# Patient Record
Sex: Male | Born: 1990
Health system: Southern US, Community
[De-identification: ages and names within clinical notes are randomized; demographics above are authoritative.]

## PROBLEM LIST (undated history)

## (undated) DIAGNOSIS — K9041 Non-celiac gluten sensitivity: Secondary | ICD-10-CM

## (undated) DIAGNOSIS — H18603 Keratoconus, unspecified, bilateral: Secondary | ICD-10-CM

## (undated) HISTORY — DX: Non-celiac gluten sensitivity: K90.41

## (undated) HISTORY — DX: Keratoconus, unspecified, bilateral: H18.603

## (undated) HISTORY — PX: SURGERY SCROTAL / TESTICULAR: SUR1316

## (undated) HISTORY — PX: KNEE SURGERY: SHX244

---

## 2013-03-29 ENCOUNTER — Ambulatory Visit: Payer: Self-pay | Admitting: General Practice

## 2014-05-25 ENCOUNTER — Emergency Department: Payer: Self-pay | Admitting: Emergency Medicine

## 2015-08-12 ENCOUNTER — Ambulatory Visit
Admission: RE | Admit: 2015-08-12 | Discharge: 2015-08-12 | Disposition: A | Discharge status: Home / Self Care | Payer: Worker's Compensation | Source: Ambulatory Visit | Attending: Family Medicine | Admitting: Family Medicine

## 2015-08-12 ENCOUNTER — Other Ambulatory Visit: Payer: Self-pay | Admitting: Family Medicine

## 2015-08-12 DIAGNOSIS — G629 Polyneuropathy, unspecified: Secondary | ICD-10-CM

## 2015-08-12 DIAGNOSIS — M5416 Radiculopathy, lumbar region: Secondary | ICD-10-CM | POA: Insufficient documentation

## 2017-01-18 ENCOUNTER — Encounter: Payer: Self-pay | Admitting: *Deleted

## 2017-01-18 ENCOUNTER — Emergency Department
Admission: EM | Admit: 2017-01-18 | Discharge: 2017-01-18 | Disposition: A | Discharge status: Home / Self Care | Payer: BLUE CROSS/BLUE SHIELD | Source: Home / Self Care | Attending: Emergency Medicine | Admitting: Emergency Medicine

## 2017-01-18 ENCOUNTER — Emergency Department: Payer: BLUE CROSS/BLUE SHIELD

## 2017-01-18 DIAGNOSIS — Y939 Activity, unspecified: Secondary | ICD-10-CM | POA: Insufficient documentation

## 2017-01-18 DIAGNOSIS — Z79899 Other long term (current) drug therapy: Secondary | ICD-10-CM

## 2017-01-18 DIAGNOSIS — M7042 Prepatellar bursitis, left knee: Secondary | ICD-10-CM | POA: Diagnosis present

## 2017-01-18 DIAGNOSIS — L03116 Cellulitis of left lower limb: Secondary | ICD-10-CM | POA: Diagnosis present

## 2017-01-18 DIAGNOSIS — A419 Sepsis, unspecified organism: Principal | ICD-10-CM | POA: Diagnosis present

## 2017-01-18 DIAGNOSIS — Z791 Long term (current) use of non-steroidal anti-inflammatories (NSAID): Secondary | ICD-10-CM

## 2017-01-18 DIAGNOSIS — Z792 Long term (current) use of antibiotics: Secondary | ICD-10-CM

## 2017-01-18 DIAGNOSIS — N179 Acute kidney failure, unspecified: Secondary | ICD-10-CM | POA: Diagnosis present

## 2017-01-18 DIAGNOSIS — E876 Hypokalemia: Secondary | ICD-10-CM | POA: Diagnosis present

## 2017-01-18 DIAGNOSIS — Z79891 Long term (current) use of opiate analgesic: Secondary | ICD-10-CM

## 2017-01-18 DIAGNOSIS — B9561 Methicillin susceptible Staphylococcus aureus infection as the cause of diseases classified elsewhere: Secondary | ICD-10-CM | POA: Diagnosis present

## 2017-01-18 LAB — COMPREHENSIVE METABOLIC PANEL
ALBUMIN: 4.9 g/dL (ref 3.5–5.0)
ALT: 19 U/L (ref 17–63)
AST: 21 U/L (ref 15–41)
Alkaline Phosphatase: 55 U/L (ref 38–126)
Anion gap: 7 (ref 5–15)
BUN: 13 mg/dL (ref 6–20)
CALCIUM: 10 mg/dL (ref 8.9–10.3)
CO2: 25 mmol/L (ref 22–32)
CREATININE: 1.28 mg/dL — AB (ref 0.61–1.24)
Chloride: 107 mmol/L (ref 101–111)
GFR calc Af Amer: >60 mL/min (ref >60)
Glucose, Bld: 134 mg/dL — ABNORMAL HIGH (ref 65–99)
POTASSIUM: 3.9 mmol/L (ref 3.5–5.1)
SODIUM: 139 mmol/L (ref 135–145)
TOTAL PROTEIN: 7.5 g/dL (ref 6.5–8.1)
Total Bilirubin: 1.5 mg/dL — ABNORMAL HIGH (ref 0.3–1.2)

## 2017-01-18 LAB — CBC WITH DIFFERENTIAL/PLATELET
BASOS ABS: 0 10*3/uL (ref 0–0.1)
BASOS PCT: 0 %
EOS ABS: 0.1 10*3/uL (ref 0–0.7)
EOS PCT: 1 %
HCT: 44.8 % (ref 40.0–52.0)
Hemoglobin: 15.8 g/dL (ref 13.0–18.0)
Lymphocytes Relative: 9 %
Lymphs Abs: 1.5 10*3/uL (ref 1.0–3.6)
MCH: 29.7 pg (ref 26.0–34.0)
MCHC: 35.2 g/dL (ref 32.0–36.0)
MCV: 84.3 fL (ref 80.0–100.0)
MONO ABS: 1.2 10*3/uL — AB (ref 0.2–1.0)
MONOS PCT: 7 %
Neutro Abs: 13.9 10*3/uL — ABNORMAL HIGH (ref 1.4–6.5)
Neutrophils Relative %: 83 %
PLATELETS: 200 10*3/uL (ref 150–440)
RBC: 5.31 MIL/uL (ref 4.40–5.90)
RDW: 13.1 % (ref 11.5–14.5)
WBC: 16.8 10*3/uL — ABNORMAL HIGH (ref 3.8–10.6)

## 2017-01-18 LAB — CHLAMYDIA/NGC RT PCR (ARMC ONLY)
CHLAMYDIA TR: NOT DETECTED
N gonorrhoeae: NOT DETECTED

## 2017-01-18 MED ORDER — CLINDAMYCIN PHOSPHATE 600 MG/50ML IV SOLN
600.0000 mg | Freq: Once | INTRAVENOUS | Status: AC
  Administered 2017-01-18: 600 mg via INTRAVENOUS
  Filled 2017-01-18: qty 50

## 2017-01-18 MED ORDER — FENTANYL CITRATE (PF) 100 MCG/2ML IJ SOLN
50.0000 ug | Freq: Once | INTRAMUSCULAR | Status: AC
  Administered 2017-01-18: 50 ug via INTRAMUSCULAR
  Filled 2017-01-18: qty 2

## 2017-01-18 MED ORDER — IBUPROFEN 600 MG PO TABS
600.0000 mg | ORAL_TABLET | Freq: Once | ORAL | Status: AC
  Administered 2017-01-18: 600 mg via ORAL
  Filled 2017-01-18: qty 1

## 2017-01-18 MED ORDER — CEPHALEXIN 500 MG PO CAPS: 500.0000 mg | ORAL_CAPSULE | Freq: Four times a day (QID) | ORAL | 0 refills | Status: DC

## 2017-01-18 MED ORDER — OXYCODONE-ACETAMINOPHEN 5-325 MG PO TABS: 1.0000 | ORAL_TABLET | Freq: Four times a day (QID) | ORAL | 0 refills | Status: DC | PRN

## 2017-01-18 MED ORDER — SULFAMETHOXAZOLE-TRIMETHOPRIM 800-160 MG PO TABS: 1.0000 | ORAL_TABLET | Freq: Two times a day (BID) | ORAL | 0 refills | Status: DC

## 2017-01-18 MED ORDER — CLINDAMYCIN HCL 150 MG PO CAPS: 300.0000 mg | ORAL_CAPSULE | Freq: Once | ORAL | Status: DC

## 2017-01-18 MED ORDER — IBUPROFEN 600 MG PO TABS: 600.0000 mg | ORAL_TABLET | Freq: Four times a day (QID) | ORAL | 0 refills | Status: DC | PRN

## 2017-01-18 MED ORDER — ACETAMINOPHEN 500 MG PO TABS
1000.0000 mg | ORAL_TABLET | Freq: Once | ORAL | Status: AC
Start: 2017-01-18 — End: 2017-01-18
  Administered 2017-01-18: 1000 mg via ORAL
  Filled 2017-01-18: qty 2

## 2017-01-18 MED ORDER — OXYCODONE HCL 5 MG PO TABS
5.0000 mg | ORAL_TABLET | Freq: Once | ORAL | Status: AC
  Administered 2017-01-18: 5 mg via ORAL
  Filled 2017-01-18: qty 1

## 2017-01-18 NOTE — Discharge Instructions (Signed)
follow-up with orthopedics tomorrow for reevaluation. Return to the emergency room for worsening pain, inability to move your knee, fever or chills, nausea, or vomiting. Take antibiotics as prescribed. Take 600 mg of ibuprofen every 6 hours and 1 Percocet as needed every 4-6 hours. Elevate the leg and apply ice.

## 2017-01-18 NOTE — ED Notes (Signed)
Pt resting in bed with SO at bedside. Will continue to monitor for further patient needs. Medication administered per MD order.

## 2017-01-18 NOTE — ED Notes (Signed)
NAD noted at time of D/C. Pt taken to lobby via wheelchair by his SO. Denies any comments/concerns at this time.

## 2017-01-18 NOTE — ED Notes (Signed)
MD at bedside to drain L knee and collect specimens.

## 2017-01-18 NOTE — ED Triage Notes (Signed)
Pt c/o swollen, stabbing pain to left knee. Noticed some discomfort last night and woke this am to 8/10 pain. Non weigh bearing without extreme pain. No history of injury to this knee

## 2017-01-18 NOTE — ED Provider Notes (Signed)
[facility] Emergency Department Provider Note  ____________________________________________  Time seen: Approximately 2:04 PM  I have reviewed the triage vital signs and the nursing notes.   HISTORY  Chief Complaint Knee Pain   HPI Adrian Hughes is a 26 y.o. male no significant past medical history who presents for evaluation of left knee pain and swelling. Patient reports that he kneed on the ground yesterday and he started having pain in his left knee. This morning when he woke up his knee was red and swollen. He is complaining of severe throbbing/sharp constant pain that is worse with ambulation. No fever or chills. No trauma to the knee. Patient is not immunosuppressed. No bleeding disorder.  History reviewed. No pertinent past medical history.  There are no active problems to display for this patient.   History reviewed. No pertinent surgical history.  Prior to Admission medications   Medication Sig Start Date End Date Taking? Authorizing Provider  cephALEXin (KEFLEX) 500 MG capsule Take 1 capsule (500 mg total) by mouth 4 (four) times daily. 01/18/17 01/28/17  Nita Sickle, MD  ibuprofen (ADVIL,MOTRIN) 600 MG tablet Take 1 tablet (600 mg total) by mouth every 6 (six) hours as needed. 01/18/17   Nita Sickle, MD  oxyCODONE-acetaminophen (ROXICET) 5-325 MG tablet Take 1 tablet by mouth every 6 (six) hours as needed. 01/18/17 01/18/18  Nita Sickle, MD  sulfamethoxazole-trimethoprim (BACTRIM DS,SEPTRA DS) 800-160 MG tablet Take 1 tablet by mouth 2 (two) times daily. 01/18/17 01/28/17  Nita Sickle, MD    Allergies Patient has no known allergies.  No family history on file.  Social History Social History  Substance Use Topics  . Smoking status: Never Smoker  . Smokeless tobacco: Never Used  . Alcohol use Yes     Comment: occ    Review of Systems  Constitutional: Negative for fever. Eyes: Negative for visual  changes. ENT: Negative for sore throat. Neck: No neck pain  Cardiovascular: Negative for chest pain. Respiratory: Negative for shortness of breath. Gastrointestinal: Negative for abdominal pain, vomiting or diarrhea. Genitourinary: Negative for dysuria. Musculoskeletal: Negative for back pain. + left knee pain and swelling Skin: Negative for rash. Neurological: Negative for headaches, weakness or numbness. Psych: No SI or HI  ____________________________________________   PHYSICAL EXAM:  VITAL SIGNS: ED Triage Vitals  Enc Vitals Group     BP 01/18/17 1133 (!) 143/92     Pulse Rate 01/18/17 1133 93     Resp 01/18/17 1133 18     Temp 01/18/17 1133 98.1 F (36.7 C)     Temp Source 01/18/17 1133 Oral     SpO2 01/18/17 1133 100 %     Weight 01/18/17 1134 200 lb (90.7 kg)     Height 01/18/17 1134  (1.854 m)     Head Circumference --      Peak Flow --      Pain Score 01/18/17 1133 8     Pain Loc --      Pain Edu? --      Excl. in GC? --     Constitutional: Alert and oriented. Well appearing and in no apparent distress. HEENT:      Head: Normocephalic and atraumatic.         Eyes: Conjunctivae are normal. Sclera is non-icteric.       Mouth/Throat: Mucous membranes are moist.       Neck: Supple with no signs of meningismus. Cardiovascular: Regular rate and rhythm. No murmurs, gallops, or  rubs. 2+ symmetrical distal pulses are present in all extremities. No JVD. Respiratory: Normal respiratory effort. Lungs are clear to auscultation bilaterally. No wheezes, crackles, or rhonchi.  Gastrointestinal: Soft, non tender, and non distended with positive bowel sounds. No rebound or guarding. Musculoskeletal: significant swelling, warmth, and erythema of the left knee at the prepatellar region. Patient has full passive range of motion of the knee Neurologic: Normal speech and language. Face is symmetric. Moving all extremities. No gross focal neurologic deficits are  appreciated. Skin: Skin is warm, dry and intact. No rash noted. Psychiatric: Mood and affect are normal. Speech and behavior are normal.  ____________________________________________   LABS (all labs ordered are listed, but only abnormal results are displayed)  Labs Reviewed  CBC WITH DIFFERENTIAL/PLATELET - Abnormal; Notable for the following:       Result Value   WBC 16.8 (*)    Neutro Abs 13.9 (*)    Monocytes Absolute 1.2 (*)    All other components within normal limits  COMPREHENSIVE METABOLIC PANEL - Abnormal; Notable for the following:    Glucose, Bld 134 (*)    Creatinine, Ser 1.28 (*)    Total Bilirubin 1.5 (*)    All other components within normal limits  CHLAMYDIA/NGC RT PCR ([facility] ONLY)  BODY FLUID CULTURE   ____________________________________________  EKG  none  ____________________________________________  RADIOLOGY  XR knee:  Prepatellar soft tissue swelling suggesting prepatellar bursitis. No acute osseous findings. ____________________________________________   PROCEDURES  Procedure(s) performed: yes .Joint Aspiration/Arthrocentesis Date/Time: 01/18/2017 2:08 PM Performed by: Nita Sickle Authorized by: Nita Sickle   Consent:    Consent obtained:  Verbal   Consent given by:  Patient   Risks discussed:  Bleeding, infection, pain and incomplete drainage Location:    Location:  Knee (patela)   Knee:  L knee Procedure details:    Preparation: Patient was prepped and draped in usual sterile fashion     Needle gauge:  22 G   Ultrasound guidance: yes     Aspirate characteristics:  Bloody Post-procedure details:    Dressing:  Adhesive bandage   Patient tolerance of procedure:  Tolerated well, no immediate complications   Critical Care performed:  None ____________________________________________   INITIAL IMPRESSION / ASSESSMENT AND PLAN / ED COURSE  26 y.o. male no significant past medical history who presents for  evaluation of left knee pain and swelling that Started yesterday after patient was on his knee. No trauma. Patient has no fever and full passive range of motion of the knee and therefore at this time I don't believe the infection is in the patient's joint. X-ray concerning for prepatellar bursitis. Small amount of fluid was collected and sent to the lab for culture. Discussed with Dr. Hyacinth Meeker, orthopedics on call who recommended dose of IV antibiotics and discharged patient home on Keflex and Septra, Ace wrap, ice, and close follow-up in the office in 2 days.     Pertinent labs & imaging results that were available during my care of the patient were reviewed by me and considered in my medical decision making (see chart for details).    ____________________________________________   FINAL CLINICAL IMPRESSION(S) / ED DIAGNOSES  Final diagnoses:  Prepatellar bursitis of left knee      NEW MEDICATIONS STARTED DURING THIS VISIT:  New Prescriptions   CEPHALEXIN (KEFLEX) 500 MG CAPSULE    Take 1 capsule (500 mg total) by mouth 4 (four) times daily.   IBUPROFEN (ADVIL,MOTRIN) 600 MG TABLET    Take  1 tablet (600 mg total) by mouth every 6 (six) hours as needed.   OXYCODONE-ACETAMINOPHEN (ROXICET) 5-325 MG TABLET    Take 1 tablet by mouth every 6 (six) hours as needed.   SULFAMETHOXAZOLE-TRIMETHOPRIM (BACTRIM DS,SEPTRA DS) 800-160 MG TABLET    Take 1 tablet by mouth 2 (two) times daily.     Note:  This document was prepared using Dragon voice recognition software and may include unintentional dictation errors.    Don Perking, [place], MD 01/18/17 [phone]

## 2017-01-19 ENCOUNTER — Encounter: Payer: Self-pay | Admitting: Emergency Medicine

## 2017-01-19 ENCOUNTER — Inpatient Hospital Stay
Admission: EM | Admit: 2017-01-19 | Discharge: 2017-01-25 | DRG: 872 | Disposition: A | Discharge status: Home / Self Care | Payer: BLUE CROSS/BLUE SHIELD | Attending: Internal Medicine | Admitting: Internal Medicine

## 2017-01-19 DIAGNOSIS — A419 Sepsis, unspecified organism: Secondary | ICD-10-CM | POA: Diagnosis present

## 2017-01-19 DIAGNOSIS — M7042 Prepatellar bursitis, left knee: Secondary | ICD-10-CM

## 2017-01-19 LAB — COMPREHENSIVE METABOLIC PANEL
ALT: 15 U/L — ABNORMAL LOW (ref 17–63)
ANION GAP: 10 (ref 5–15)
AST: 17 U/L (ref 15–41)
Albumin: 4.3 g/dL (ref 3.5–5.0)
Alkaline Phosphatase: 55 U/L (ref 38–126)
BILIRUBIN TOTAL: 0.8 mg/dL (ref 0.3–1.2)
BUN: 12 mg/dL (ref 6–20)
CO2: 24 mmol/L (ref 22–32)
Calcium: 9 mg/dL (ref 8.9–10.3)
Chloride: 105 mmol/L (ref 101–111)
Creatinine, Ser: 1.44 mg/dL — ABNORMAL HIGH (ref 0.61–1.24)
Glucose, Bld: 118 mg/dL — ABNORMAL HIGH (ref 65–99)
POTASSIUM: 3.4 mmol/L — AB (ref 3.5–5.1)
Sodium: 139 mmol/L (ref 135–145)
TOTAL PROTEIN: 7 g/dL (ref 6.5–8.1)

## 2017-01-19 LAB — CBC WITH DIFFERENTIAL/PLATELET
Basophils Absolute: 0 10*3/uL (ref 0–0.1)
Basophils Relative: 0 %
Eosinophils Absolute: 0.2 10*3/uL (ref 0–0.7)
Eosinophils Relative: 1 %
HEMATOCRIT: 41.6 % (ref 40.0–52.0)
Hemoglobin: 14.5 g/dL (ref 13.0–18.0)
Lymphocytes Relative: 7 %
Lymphs Abs: 1.1 10*3/uL (ref 1.0–3.6)
MCH: 29.3 pg (ref 26.0–34.0)
MCHC: 34.9 g/dL (ref 32.0–36.0)
MCV: 84 fL (ref 80.0–100.0)
MONO ABS: 1.5 10*3/uL — AB (ref 0.2–1.0)
MONOS PCT: 10 %
NEUTROS ABS: 12.8 10*3/uL — AB (ref 1.4–6.5)
Neutrophils Relative %: 82 %
Platelets: 169 10*3/uL (ref 150–440)
RBC: 4.96 MIL/uL (ref 4.40–5.90)
RDW: 13.1 % (ref 11.5–14.5)
WBC: 15.6 10*3/uL — ABNORMAL HIGH (ref 3.8–10.6)

## 2017-01-19 MED ORDER — KETOROLAC TROMETHAMINE 30 MG/ML IJ SOLN
15.0000 mg | Freq: Once | INTRAMUSCULAR | Status: AC
  Administered 2017-01-19: 15 mg via INTRAVENOUS
  Filled 2017-01-19: qty 1

## 2017-01-19 MED ORDER — SODIUM CHLORIDE 0.9 % IV BOLUS (SEPSIS)
1000.0000 mL | Freq: Once | INTRAVENOUS | Status: AC
  Administered 2017-01-19: 1000 mL via INTRAVENOUS

## 2017-01-19 MED ORDER — CLINDAMYCIN PHOSPHATE 900 MG/50ML IV SOLN
900.0000 mg | Freq: Once | INTRAVENOUS | Status: AC
  Administered 2017-01-19: 900 mg via INTRAVENOUS
  Filled 2017-01-19: qty 50

## 2017-01-19 NOTE — ED Triage Notes (Addendum)
Pt presents to ED with continuing left knee pain and worsening redness. Initial onset of left knee pain and swelling noted on Monday night. Pt seen in this ED last night for the same and had "fluid drawn off" by his MD. Pt was prescribed antibiotics and pain medication and told to return with worsening symptoms. Pt states the redness has more than doubled in size since last night. Pt has been taking all medications as prescribed. Xray performed last night and pt was told no abnormality was noted. Fever earlier today <100. Pt states although probably unrelated he did get a tattoo on his back Sunday night.

## 2017-01-20 DIAGNOSIS — M7042 Prepatellar bursitis, left knee: Secondary | ICD-10-CM | POA: Diagnosis present

## 2017-01-20 DIAGNOSIS — A419 Sepsis, unspecified organism: Secondary | ICD-10-CM | POA: Diagnosis present

## 2017-01-20 DIAGNOSIS — L03116 Cellulitis of left lower limb: Secondary | ICD-10-CM | POA: Diagnosis present

## 2017-01-20 DIAGNOSIS — N179 Acute kidney failure, unspecified: Secondary | ICD-10-CM | POA: Diagnosis present

## 2017-01-20 DIAGNOSIS — E876 Hypokalemia: Secondary | ICD-10-CM | POA: Diagnosis present

## 2017-01-20 DIAGNOSIS — B9561 Methicillin susceptible Staphylococcus aureus infection as the cause of diseases classified elsewhere: Secondary | ICD-10-CM | POA: Diagnosis present

## 2017-01-20 DIAGNOSIS — Z791 Long term (current) use of non-steroidal anti-inflammatories (NSAID): Secondary | ICD-10-CM | POA: Diagnosis not present

## 2017-01-20 DIAGNOSIS — Z79891 Long term (current) use of opiate analgesic: Secondary | ICD-10-CM | POA: Diagnosis not present

## 2017-01-20 DIAGNOSIS — Z79899 Other long term (current) drug therapy: Secondary | ICD-10-CM | POA: Diagnosis not present

## 2017-01-20 DIAGNOSIS — Z792 Long term (current) use of antibiotics: Secondary | ICD-10-CM | POA: Diagnosis not present

## 2017-01-20 LAB — TSH: TSH: 2.098 u[IU]/mL (ref 0.350–4.500)

## 2017-01-20 LAB — HEMOGLOBIN A1C
Hgb A1c MFr Bld: 4.8 % (ref 4.8–5.6)
Mean Plasma Glucose: 91.06 mg/dL

## 2017-01-20 MED ORDER — IBUPROFEN 400 MG PO TABS
600.0000 mg | ORAL_TABLET | Freq: Four times a day (QID) | ORAL | Status: DC
  Administered 2017-01-20 – 2017-01-22 (×8): 600 mg via ORAL
  Filled 2017-01-20 (×8): qty 2

## 2017-01-20 MED ORDER — CEFTRIAXONE SODIUM IN DEXTROSE 20 MG/ML IV SOLN
1.0000 g | INTRAVENOUS | Status: DC
  Filled 2017-01-20: qty 50

## 2017-01-20 MED ORDER — ONDANSETRON HCL 4 MG/2ML IJ SOLN
4.0000 mg | Freq: Four times a day (QID) | INTRAMUSCULAR | Status: DC | PRN
  Administered 2017-01-20: 4 mg via INTRAVENOUS
  Filled 2017-01-20: qty 2

## 2017-01-20 MED ORDER — FENTANYL CITRATE (PF) 100 MCG/2ML IJ SOLN
100.0000 ug | INTRAMUSCULAR | Status: DC | PRN
  Administered 2017-01-20: 100 ug via INTRAVENOUS
  Filled 2017-01-20: qty 2

## 2017-01-20 MED ORDER — SODIUM CHLORIDE 0.9 % IV SOLN
INTRAVENOUS | Status: DC
  Administered 2017-01-20: 04:00:00 via INTRAVENOUS

## 2017-01-20 MED ORDER — VANCOMYCIN HCL 10 G IV SOLR
1500.0000 mg | Freq: Once | INTRAVENOUS | Status: AC
  Administered 2017-01-20: 1500 mg via INTRAVENOUS
  Filled 2017-01-20: qty 1500

## 2017-01-20 MED ORDER — KETOROLAC TROMETHAMINE 30 MG/ML IJ SOLN
15.0000 mg | Freq: Three times a day (TID) | INTRAMUSCULAR | Status: AC | PRN
  Filled 2017-01-20: qty 1

## 2017-01-20 MED ORDER — ENOXAPARIN SODIUM 40 MG/0.4ML ~~LOC~~ SOLN
40.0000 mg | SUBCUTANEOUS | Status: DC
  Administered 2017-01-21 – 2017-01-24 (×2): 40 mg via SUBCUTANEOUS
  Filled 2017-01-20 (×5): qty 0.4

## 2017-01-20 MED ORDER — ACETAMINOPHEN 650 MG RE SUPP: 650.0000 mg | Freq: Four times a day (QID) | RECTAL | Status: DC | PRN

## 2017-01-20 MED ORDER — ONDANSETRON HCL 4 MG PO TABS
4.0000 mg | ORAL_TABLET | Freq: Four times a day (QID) | ORAL | Status: DC | PRN
Start: 2017-01-20 — End: 2017-01-24

## 2017-01-20 MED ORDER — CEFTRIAXONE SODIUM IN DEXTROSE 20 MG/ML IV SOLN
1.0000 g | Freq: Once | INTRAVENOUS | Status: AC
Start: 2017-01-20 — End: 2017-01-20
  Administered 2017-01-20: 1 g via INTRAVENOUS
  Filled 2017-01-20: qty 50

## 2017-01-20 MED ORDER — LIDOCAINE HCL (PF) 1 % IJ SOLN
5.0000 mL | Freq: Once | INTRAMUSCULAR | Status: AC
Start: 2017-01-20 — End: 2017-01-20
  Administered 2017-01-20: 5 mL via INTRADERMAL
  Filled 2017-01-20: qty 5

## 2017-01-20 MED ORDER — ONDANSETRON HCL 4 MG/2ML IJ SOLN
INTRAMUSCULAR | Status: AC
  Administered 2017-01-20: 4 mg via INTRAVENOUS
  Filled 2017-01-20: qty 2

## 2017-01-20 MED ORDER — ONDANSETRON HCL 4 MG/2ML IJ SOLN
4.0000 mg | Freq: Once | INTRAMUSCULAR | Status: AC
  Administered 2017-01-20: 4 mg via INTRAVENOUS

## 2017-01-20 MED ORDER — DEXTROSE 5 % IV SOLN
INTRAVENOUS | Status: DC
  Administered 2017-01-21: 10:00:00 via INTRAVENOUS
  Filled 2017-01-20: qty 10

## 2017-01-20 MED ORDER — ACETAMINOPHEN 325 MG PO TABS
650.0000 mg | ORAL_TABLET | Freq: Four times a day (QID) | ORAL | Status: DC | PRN
  Administered 2017-01-21 – 2017-01-23 (×2): 650 mg via ORAL
  Filled 2017-01-20 (×3): qty 2

## 2017-01-20 MED ORDER — ACETAMINOPHEN 500 MG PO TABS
ORAL_TABLET | ORAL | Status: AC
  Administered 2017-01-20: 1000 mg
  Filled 2017-01-20: qty 2

## 2017-01-20 MED ORDER — DOCUSATE SODIUM 100 MG PO CAPS
100.0000 mg | ORAL_CAPSULE | Freq: Two times a day (BID) | ORAL | Status: DC
  Administered 2017-01-20 – 2017-01-25 (×4): 100 mg via ORAL
  Filled 2017-01-20 (×8): qty 1

## 2017-01-20 MED ORDER — MORPHINE SULFATE (PF) 2 MG/ML IV SOLN: 2.0000 mg | INTRAVENOUS | Status: DC | PRN

## 2017-01-20 MED ORDER — OXYCODONE-ACETAMINOPHEN 5-325 MG PO TABS
1.0000 | ORAL_TABLET | Freq: Four times a day (QID) | ORAL | Status: DC | PRN
  Administered 2017-01-20: 1 via ORAL
  Filled 2017-01-20: qty 1

## 2017-01-20 MED ORDER — VANCOMYCIN HCL 10 G IV SOLR
1250.0000 mg | Freq: Two times a day (BID) | INTRAVENOUS | Status: DC
  Administered 2017-01-20 – 2017-01-21 (×2): 1250 mg via INTRAVENOUS
  Filled 2017-01-20 (×4): qty 1250

## 2017-01-20 NOTE — Consult Note (Signed)
  ORTHOPAEDIC CONSULTATION  REQUESTING PHYSICIAN: Enedina Finner, MD  Chief Complaint: left knee pain  HPI: Adrian Hughes is a 26 y.o. male who complains of  Left knee pain. He was seen in the [facility] office yesterday after an emergency room visit the prior day. The erythema, swelling and pain increased significantly overnight and he presented again to the ED. He denies any numbness or tingling. He does report a fever but no other constitutional symptoms.  History reviewed. No pertinent past medical history. Past Surgical History:  Procedure Laterality Date  . SURGERY SCROTAL / TESTICULAR     Social History   Social History  . Marital status: Married    Spouse name: N/A  . Number of children: N/A  . Years of education: N/A   Social History Main Topics  . Smoking status: Never Smoker  . Smokeless tobacco: Never Used  . Alcohol use Yes     Comment: occ  . Drug use: No  . Sexual activity: Yes   Other Topics Concern  . None   Social History Narrative  . None   History reviewed. No pertinent family history. No Known Allergies Prior to Admission medications   Medication Sig Start Date End Date Taking? Authorizing Provider  cephALEXin (KEFLEX) 500 MG capsule Take 1 capsule (500 mg total) by mouth 4 (four) times daily. 01/18/17 01/28/17 Yes Veronese, [place], MD  ibuprofen (ADVIL,MOTRIN) 600 MG tablet Take 1 tablet (600 mg total) by mouth every 6 (six) hours as needed. 01/18/17  Yes Don Perking, [place], MD  oxyCODONE-acetaminophen (ROXICET) 5-325 MG tablet Take 1 tablet by mouth every 6 (six) hours as needed. 01/18/17 01/18/18 Yes Veronese, [place], MD  sulfamethoxazole-trimethoprim (BACTRIM DS,SEPTRA DS) 800-160 MG tablet Take 1 tablet by mouth 2 (two) times daily. 01/18/17 01/28/17 Yes Don Perking, [place], MD   No results found.  Positive ROS: All other systems have been reviewed and were otherwise negative with the exception of those mentioned in the HPI and as  above.  Physical Exam: General: Alert, no acute distress Cardiovascular: No pedal edema Respiratory: No cyanosis, no use of accessory musculature GI: No organomegaly, abdomen is soft and non-tender Skin: No lesions in the area of chief complaint Neurologic: Sensation intact distally Psychiatric: Patient is competent for consent with normal mood and affect Lymphatic: No axillary or cervical lymphadenopathy  MUSCULOSKELETAL: left knee with erythema, warmth, no drainage, no fluctuance. Tender over the anterior knee. Motor and sensory are intact distally, calf is soft and non-tender. Painless active and passive motion of the knee.  Assessment: Prepatellar bursitis and cellulitis  Plan: The diagnosis and lab results are discussed in detail with the patient and his wife. I do not believe there is any surgical indication at this time. Continue IV antibiotics and wait for final cultures and sensitivities. He is happy with the plan. If the swelling increases or the knee becomes fluctuant he would be re-evaluated for possible drainage. Please call with questions.    Lyndle Herrlich, MD    01/20/2017 3:09 PM

## 2017-01-20 NOTE — ED Notes (Signed)
Pt to the er for increased redness, swelling and fever to the left knee. Pt seen yesterday for same and has started on antibiotics. Gross swelling to the left knee r/t cellulitis.

## 2017-01-20 NOTE — ED Provider Notes (Signed)
[facility] Emergency Department Provider Note    First MD Initiated Contact with Patient 01/19/17 2342     (approximate)  I have reviewed the triage vital signs and the nursing notes.   HISTORY  Chief Complaint Joint Swelling and Knee Pain    HPI Adrian Hughes is a 26 y.o. male previously healthy with recent ER visit diagnosed with prepatellar or sinus syndrome on Keflex and Septra presents with worsening swelling, redness of low-grade fevers and pain starting today. He has been compliant with his antibiotics. States that the redness developed this afternoon after seeing the orthopedic doctor in clinic today. He has still been able to walk on it. Denies any history of tick bites. He works as a Emergency planning/management officer. Has not missed any doses of antibiotics. Pain is currently moderate to severe.   History reviewed. No pertinent past medical history. No family history on file. Past Surgical History:  Procedure Laterality Date  . SURGERY SCROTAL / TESTICULAR     There are no active problems to display for this patient.     Prior to Admission medications   Medication Sig Start Date End Date Taking? Authorizing Provider  cephALEXin (KEFLEX) 500 MG capsule Take 1 capsule (500 mg total) by mouth 4 (four) times daily. 01/18/17 01/28/17 Yes Veronese, [place], MD  ibuprofen (ADVIL,MOTRIN) 600 MG tablet Take 1 tablet (600 mg total) by mouth every 6 (six) hours as needed. 01/18/17  Yes Don Perking, [place], MD  oxyCODONE-acetaminophen (ROXICET) 5-325 MG tablet Take 1 tablet by mouth every 6 (six) hours as needed. 01/18/17 01/18/18 Yes Veronese, [place], MD  sulfamethoxazole-trimethoprim (BACTRIM DS,SEPTRA DS) 800-160 MG tablet Take 1 tablet by mouth 2 (two) times daily. 01/18/17 01/28/17 Yes Nita Sickle, MD    Allergies Patient has no known allergies.    Social History Social History  Substance Use Topics  . Smoking status: Never Smoker  . Smokeless tobacco:  Never Used  . Alcohol use Yes     Comment: occ    Review of Systems Patient denies headaches, rhinorrhea, blurry vision, numbness, shortness of breath, chest pain, edema, cough, abdominal pain, nausea, vomiting, diarrhea, dysuria, fevers, rashes or hallucinations unless otherwise stated above in HPI. ____________________________________________   PHYSICAL EXAM:  VITAL SIGNS: Vitals:   01/19/17 2034 01/20/17 0000  BP: 118/71 131/78  Pulse: (!) 114 (!) 110  Resp: 20   Temp: 98.9 F (37.2 C)   SpO2: 100% 100%    Constitutional: Alert and oriented. Well appearing and in no acute distress. Eyes: Conjunctivae are normal.  Head: Atraumatic. Nose: No congestion/rhinnorhea. Mouth/Throat: Mucous membranes are moist.   Neck: No stridor. Painless ROM.  Cardiovascular: Normal rate, regular rhythm. Grossly normal heart sounds.  Good peripheral circulation. Respiratory: Normal respiratory effort.  No retractions. Lungs CTAB. Gastrointestinal: Soft and nontender. No distention. No abdominal bruits. No CVA tenderness. Musculoskeletal: obvious swelling and erythema to left knee with prepatellar fluctuance, no drainage, no effusion noted,  Neurologic:  Normal speech and language. No gross focal neurologic deficits are appreciated. No facial droop Skin:  Skin is warm, dry and intact. Cellulitis overlying left knee Psychiatric: Mood and affect are normal. Speech and behavior are normal.  ____________________________________________   LABS (all labs ordered are listed, but only abnormal results are displayed)  Results for orders placed or performed during the hospital encounter of 01/19/17 (from the past 24 hour(s))  CBC with Differential     Status: Abnormal   Collection Time: 01/19/17  8:55 PM  Result Value Ref Range   WBC 15.6 (H) 3.8 - 10.6 K/uL   RBC 4.96 4.40 - 5.90 MIL/uL   Hemoglobin 14.5 13.0 - 18.0 g/dL   HCT 16.1 09.6 - 04.5 %   MCV 84.0 80.0 - 100.0 fL   MCH 29.3 26.0 - 34.0  pg   MCHC 34.9 32.0 - 36.0 g/dL   RDW 40.9 81.1 - 91.4 %   Platelets 169 150 - 440 K/uL   Neutrophils Relative % 82 %   Neutro Abs 12.8 (H) 1.4 - 6.5 K/uL   Lymphocytes Relative 7 %   Lymphs Abs 1.1 1.0 - 3.6 K/uL   Monocytes Relative 10 %   Monocytes Absolute 1.5 (H) 0.2 - 1.0 K/uL   Eosinophils Relative 1 %   Eosinophils Absolute 0.2 0 - 0.7 K/uL   Basophils Relative 0 %   Basophils Absolute 0.0 0 - 0.1 K/uL  Comprehensive metabolic panel     Status: Abnormal   Collection Time: 01/19/17  8:55 PM  Result Value Ref Range   Sodium 139 135 - 145 mmol/L   Potassium 3.4 (L) 3.5 - 5.1 mmol/L   Chloride 105 101 - 111 mmol/L   CO2 24 22 - 32 mmol/L   Glucose, Bld 118 (H) 65 - 99 mg/dL   BUN 12 6 - 20 mg/dL   Creatinine, Ser 7.82 (H) 0.61 - 1.24 mg/dL   Calcium 9.0 8.9 - 95.6 mg/dL   Total Protein 7.0 6.5 - 8.1 g/dL   Albumin 4.3 3.5 - 5.0 g/dL   AST 17 15 - 41 U/L   ALT 15 (L) 17 - 63 U/L   Alkaline Phosphatase 55 38 - 126 U/L   Total Bilirubin 0.8 0.3 - 1.2 mg/dL   GFR calc non Af Amer >60 >60 mL/min   GFR calc Af Amer >60 >60 mL/min   Anion gap 10 5 - 15   ____________________________________________   ____________________________________________  RADIOLOGY  EMERGENCY DEPARTMENT US SOFT TISSUE INTERPRETATION "Study: Limited Soft Tissue Ultrasound"  INDICATIONS: Soft tissue infection Multiple views of the body part were obtained in real-time with a multi-frequency linear probe  PERFORMED BY: Myself IMAGES ARCHIVED?: No SIDE:Left BODY PART:Lower extremity INTERPRETATION:  Cellulitis present    ____________________________________________   PROCEDURES  Procedure(s) performed:  .Joint Aspiration/Arthrocentesis Date/Time: 01/20/2017 12:56 AM Performed by: Willy Eddy Authorized by: Willy Eddy   Consent:    Consent obtained:  Verbal   Consent given by:  Patient   Risks discussed:  Bleeding, incomplete drainage, infection, nerve damage, pain and  poor cosmetic result   Alternatives discussed:  No treatment Location:    Location: prepatellar bursa. Anesthesia (see MAR for exact dosages):    Anesthesia method:  Local infiltration   Local anesthetic:  Lidocaine 1% w/o epi Procedure details:    Preparation: Patient was prepped and draped in usual sterile fashion     Needle gauge:  18 G   Approach:  Anterior   Aspirate amount:  3   Aspirate characteristics:  Blood-tinged Post-procedure details:    Dressing:  Sterile dressing   Patient tolerance of procedure:  Tolerated well, no immediate complications      Critical Care performed: no ____________________________________________   INITIAL IMPRESSION / ASSESSMENT AND PLAN / ED COURSE  Pertinent labs & imaging results that were available during my care of the patient were reviewed by me and considered in my medical decision making (see chart for details).  DDX: septic bursitis, cellulitis, abscess, arthritis  Adrian Hughes  P Baumgardner is a 25 y.o. who presents to the ED with evidence of worsening bursitis and now cellulitis of the left knee. Blood work is fairly reassuring with a stable to slightly improved leukocytosis. He does have mild tachycardia here and reported fever at home before he does meet Sirs criteria but is not clinically septic appearing at this time. Symptoms more consistent with prepatellar bursitis and surrounding cellulitis that are failing outpatient antibiotics at this time. I spoke with Dr. Joice Lofts who advised aspiration. Procedure performed as described above and patient tolerated well. As per fluid sent for Gram stain and culture. Patient given dose of IV clindamycin as well as Rocephin. Given IV fluids for tachycardia. As per orthopedics recommendations and his worsening symptoms despite outpatient antibiotics were discussed case with hospitalist for admission.      ____________________________________________   FINAL CLINICAL IMPRESSION(S) / ED DIAGNOSES  Final  diagnoses:  Prepatellar bursitis of left knee      NEW MEDICATIONS STARTED DURING THIS VISIT:  New Prescriptions   No medications on file     Note:  This document was prepared using Dragon voice recognition software and may include unintentional dictation errors.    Willy Eddy, MD 01/20/17 [phone]

## 2017-01-20 NOTE — ED Notes (Signed)
ED Provider at bedside to drain fluid from knee.

## 2017-01-20 NOTE — Progress Notes (Signed)
Pharmacy Antibiotic Note  Adrian Hughes is a 26 y.o. male admitted on 01/19/2017 with sepsiscellulitis/bursitis arthritis.  Pharmacy has been consulted for vancomycin dosing.  Plan: DW 91kg  Vd 64L kei 0.076 hr-1  T1/2 10 hours Vancomycin 1250 mg q 12 hours ordered with stacked dosing. Level before 5th dose. Goal trough 15-20  Height:  (185.4 cm) Weight: 199 lb 9.6 oz (90.5 kg) IBW/kg (Calculated) : 79.9  Temp (24hrs), Avg:98.6 F (37 C), Min:98.3 F (36.8 C), Max:98.9 F (37.2 C)   Recent Labs Lab 01/18/17 1138 01/19/17 2055  WBC 16.8* 15.6*  CREATININE 1.28* 1.44*    Estimated Creatinine Clearance: 86.3 mL/min (A) (by C-G formula based on SCr of 1.44 mg/dL (H)).    No Known Allergies  Antimicrobials this admission: Ceftriaxone, vancomycin 10/4  >>    >>   Dose adjustments this admission:   Microbiology results: 10/2 knee aspirate: few Hughes. aureus   Thank you for allowing pharmacy to be a part of this patient'Hughes care.  Adrian Hughes,Adrian Hughes 01/20/2017 4:56 AM

## 2017-01-20 NOTE — Progress Notes (Signed)
[facility] [place] [facility]   PATIENT NAME: Adrian Hughes    MR#:  [id number]  DATE OF BIRTH:  Sep 18, 1990  Patient presented with left knee redness swelling and pain for 3 days.  REVIEW OF SYSTEMS:   Review of Systems  Constitutional: Positive for fever. Negative for chills and weight loss.  HENT: Negative for ear discharge, ear pain and nosebleeds.   Eyes: Negative for blurred vision, pain and discharge.  Respiratory: Negative for sputum production, shortness of breath, wheezing and stridor.   Cardiovascular: Negative for chest pain, palpitations, orthopnea and PND.  Gastrointestinal: Negative for abdominal pain, diarrhea, nausea and vomiting.  Genitourinary: Negative for frequency and urgency.  Musculoskeletal: Positive for joint pain. Negative for back pain.  Neurological: Negative for sensory change, speech change, focal weakness and weakness.  Psychiatric/Behavioral: Negative for depression and hallucinations. The patient is not nervous/anxious.    Tolerating Diet:yes Tolerating PT:  Not needed  DRUG ALLERGIES:  No Known Allergies  VITALS:  Blood pressure (!) 107/56, pulse 91, temperature 98.7 F (37.1 C), temperature source Oral, resp. rate 14, height  (1.854 m), weight 90.5 kg (199 lb 9.6 oz), SpO2 99 %.  PHYSICAL EXAMINATION:   Physical Exam  GENERAL:  26 y.o.-year-old patient lying in the bed with no acute distress.  EYES: Pupils equal, round, reactive to light and accommodation. No scleral icterus. Extraocular muscles intact.  HEENT: Head atraumatic, normocephalic. Oropharynx and nasopharynx clear.  NECK:  Supple, no jugular venous distention. No thyroid enlargement, no tenderness.  LUNGS: Normal breath sounds bilaterally, no wheezing, rales, rhonchi. No use of accessory muscles of respiration.  CARDIOVASCULAR: S1, S2 normal. No murmurs, rubs, or gallops.  ABDOMEN: Soft, nontender, nondistended. Bowel sounds present. No  organomegaly or mass.  EXTREMITIES: No cyanosis, clubbing or edema b/l.   Left knee cellutitis NEUROLOGIC: Cranial nerves II through XII are intact. No focal Motor or sensory deficits b/l.   PSYCHIATRIC:  patient is alert and oriented x 3.  SKIN: No obvious rash, lesion, or ulcer.   LABORATORY PANEL:  CBC  Recent Labs Lab 01/19/17 2055  WBC 15.6*  HGB 14.5  HCT 41.6  PLT 169    Chemistries   Recent Labs Lab 01/19/17 2055  NA 139  K 3.4*  CL 105  CO2 24  GLUCOSE 118*  BUN 12  CREATININE 1.44*  CALCIUM 9.0  AST 17  ALT 15*  ALKPHOS 55  BILITOT 0.8   Cardiac Enzymes No results for input(s): TROPONINI in the last 168 hours. RADIOLOGY:  Dg Knee Complete 4 Views Left  Result Date: 01/18/2017 CLINICAL DATA:  Knee pain and swelling. Limited weight-bearing. No acute injury. EXAM: LEFT KNEE - COMPLETE 4+ VIEW COMPARISON:  None. FINDINGS: The mineralization and alignment are normal. There is no evidence of acute fracture or dislocation. The joint spaces are maintained. There is no significant knee joint effusion. There is prepatellar soft tissue swelling without evidence of foreign body or soft tissue emphysema. IMPRESSION: Prepatellar soft tissue swelling suggesting prepatellar bursitis. No acute osseous findings. Electronically Signed   By: Adrian Hughes M.D.   On: 01/18/2017 12:52   ASSESSMENT AND PLAN:  26 year old male admitted for sepsis.  1. Sepsis: The patient meets criteria via tachycardia and leukocytosis. He is hemodynamically stable. Blood cultures have been obtained which we will follow for growth and sensitivities.  2. Left Knee Cellulitis: Surrounding erythema left knee. Scant amount of bursal fluid aspirated in the emergency department. Cultures pending.  Continue ceftriaxone and vancomycin -Xray knee shows prepatellar effusion -Dr Real Cons to see pt -ibuprofen and prn toradol -pt not tolerating po narcotics  3. AKI: Hydrate with intravenous fluid. Avoid  nephrotoxic agents. -watch creat  4. Hypokalemia: Replete potassium  5. DVT prophylaxis: Lovenox  Case discussed with Care Management/Social Worker. Management plans discussed with the patient, family and they are in agreement.  CODE STATUS: full  DVT Prophylaxis: lovenox  TOTAL TIME TAKING CARE OF THIS PATIENT: *40* minutes.  >50% time spent on counselling and coordination of care  POSSIBLE D/C IN *2* DAYS, DEPENDING ON CLINICAL CONDITION.  Note: This dictation was prepared with Dragon dictation along with smaller phrase technology. Any transcriptional errors that result from this process are unintentional.  Adrian Hughes M.D on 01/20/2017 at 12:06 PM  Between 7am to 6pm - Pager - [phone]  After 6pm go to [email or web address] - password [email or web address]  [facility]  Office  [phone]  CC: Primary care physician; Adrian Hughes, MDPatient ID: Adrian Hughes, male   DOB: 1991/02/19, 26 y.o.   MRN: [id number]

## 2017-01-20 NOTE — H&P (Signed)
Adrian Hughes is an 26 y.o. male.   Chief Complaint: Knee pain HPI: The patient. No chronic medical illnesses presents to the emergency department for the second time in his many days complaining of left knee pain. The patient was diagnosed with bursitis on his first emergency department visit. He was given Bactrim but developed subjective fever as well as nausea. The patient has had 1 episode of nonbloody nonbilious emesis. He returns tonight for evaluation was found to have elevated white blood cell count as well as tachycardia. He was given ceftriaxone and clindamycin in the emergency department prior to the hospitalist service being called for admission.  History reviewed. No pertinent past medical history. None  Past Surgical History:  Procedure Laterality Date  . SURGERY SCROTAL / TESTICULAR      No family history on file. None Social History:  reports that he has never smoked. He has never used smokeless tobacco. He reports that he drinks alcohol. He reports that he does not use drugs.  Allergies: No Known Allergies  Medications Prior to Admission  Medication Sig Dispense Refill  . cephALEXin (KEFLEX) 500 MG capsule Take 1 capsule (500 mg total) by mouth 4 (four) times daily. 40 capsule 0  . ibuprofen (ADVIL,MOTRIN) 600 MG tablet Take 1 tablet (600 mg total) by mouth every 6 (six) hours as needed. 20 tablet 0  . oxyCODONE-acetaminophen (ROXICET) 5-325 MG tablet Take 1 tablet by mouth every 6 (six) hours as needed. 20 tablet 0  . sulfamethoxazole-trimethoprim (BACTRIM DS,SEPTRA DS) 800-160 MG tablet Take 1 tablet by mouth 2 (two) times daily. 20 tablet 0    Results for orders placed or performed during the hospital encounter of 01/19/17 (from the past 48 hour(s))  CBC with Differential     Status: Abnormal   Collection Time: 01/19/17  8:55 PM  Result Value Ref Range   WBC 15.6 (H) 3.8 - 10.6 K/uL   RBC 4.96 4.40 - 5.90 MIL/uL   Hemoglobin 14.5 13.0 - 18.0 g/dL   HCT 41.6 40.0 -  52.0 %   MCV 84.0 80.0 - 100.0 fL   MCH 29.3 26.0 - 34.0 pg   MCHC 34.9 32.0 - 36.0 g/dL   RDW 13.1 11.5 - 14.5 %   Platelets 169 150 - 440 K/uL   Neutrophils Relative % 82 %   Neutro Abs 12.8 (H) 1.4 - 6.5 K/uL   Lymphocytes Relative 7 %   Lymphs Abs 1.1 1.0 - 3.6 K/uL   Monocytes Relative 10 %   Monocytes Absolute 1.5 (H) 0.2 - 1.0 K/uL   Eosinophils Relative 1 %   Eosinophils Absolute 0.2 0 - 0.7 K/uL   Basophils Relative 0 %   Basophils Absolute 0.0 0 - 0.1 K/uL  Comprehensive metabolic panel     Status: Abnormal   Collection Time: 01/19/17  8:55 PM  Result Value Ref Range   Sodium 139 135 - 145 mmol/L   Potassium 3.4 (L) 3.5 - 5.1 mmol/L   Chloride 105 101 - 111 mmol/L   CO2 24 22 - 32 mmol/L   Glucose, Bld 118 (H) 65 - 99 mg/dL   BUN 12 6 - 20 mg/dL   Creatinine, Ser 1.44 (H) 0.61 - 1.24 mg/dL   Calcium 9.0 8.9 - 10.3 mg/dL   Total Protein 7.0 6.5 - 8.1 g/dL   Albumin 4.3 3.5 - 5.0 g/dL   AST 17 15 - 41 U/L   ALT 15 (L) 17 - 63 U/L   Alkaline   Phosphatase 55 38 - 126 U/L   Total Bilirubin 0.8 0.3 - 1.2 mg/dL   GFR calc non Af Amer >60 >60 mL/min   GFR calc Af Amer >60 >60 mL/min    Comment: (NOTE) The eGFR has been calculated using the CKD EPI equation. This calculation has not been validated in all clinical situations. eGFR's persistently <60 mL/min signify possible Chronic Kidney Disease.    Anion gap 10 5 - 15  TSH     Status: None   Collection Time: 01/19/17  8:55 PM  Result Value Ref Range   TSH 2.098 0.350 - 4.500 uIU/mL    Comment: Performed by a 3rd Generation assay with a functional sensitivity of <=0.01 uIU/mL.   Dg Knee Complete 4 Views Left  Result Date: 01/18/2017 CLINICAL DATA:  Knee pain and swelling. Limited weight-bearing. No acute injury. EXAM: LEFT KNEE - COMPLETE 4+ VIEW COMPARISON:  None. FINDINGS: The mineralization and alignment are normal. There is no evidence of acute fracture or dislocation. The joint spaces are maintained. There is no  significant knee joint effusion. There is prepatellar soft tissue swelling without evidence of foreign body or soft tissue emphysema. IMPRESSION: Prepatellar soft tissue swelling suggesting prepatellar bursitis. No acute osseous findings. Electronically Signed   By: Richardean Sale M.D.   On: 01/18/2017 12:52    Review of Systems  Constitutional: Negative for chills and fever.  HENT: Negative for sore throat and tinnitus.   Eyes: Negative for blurred vision and redness.  Respiratory: Negative for cough and shortness of breath.   Cardiovascular: Negative for chest pain, palpitations, orthopnea and PND.  Gastrointestinal: Negative for abdominal pain, diarrhea, nausea and vomiting.  Genitourinary: Negative for dysuria, frequency and urgency.  Musculoskeletal: Positive for joint pain. Negative for myalgias.  Skin: Negative for rash.       No lesions  Neurological: Negative for speech change, focal weakness and weakness.  Endo/Heme/Allergies: Does not bruise/bleed easily.       No temperature intolerance  Psychiatric/Behavioral: Negative for depression and suicidal ideas.    Blood pressure 124/76, pulse 93, temperature 98.3 F (36.8 C), temperature source Oral, resp. rate 16, height 6' 1" (1.854 m), weight 90.5 kg (199 lb 9.6 oz), SpO2 100 %. Physical Exam  Vitals reviewed. Constitutional: He is oriented to person, place, and time. He appears well-developed and well-nourished. No distress.  HENT:  Head: Normocephalic and atraumatic.  Mouth/Throat: Oropharynx is clear and moist.  Eyes: Pupils are equal, round, and reactive to light. Conjunctivae and EOM are normal. No scleral icterus.  Neck: Normal range of motion. Neck supple. No JVD present. No tracheal deviation present. No thyromegaly present.  Cardiovascular: Normal rate, regular rhythm and normal heart sounds.  Exam reveals no gallop and no friction rub.   No murmur heard. Respiratory: Effort normal and breath sounds normal. No  respiratory distress.  GI: Soft. Bowel sounds are normal. He exhibits no distension. There is no tenderness.  Genitourinary:  Genitourinary Comments: Deferred  Musculoskeletal: Normal range of motion. He exhibits no edema.  Lymphadenopathy:    He has no cervical adenopathy.  Neurological: He is alert and oriented to person, place, and time. No cranial nerve deficit.  Skin: Skin is warm and dry. No rash noted. No erythema.  Psychiatric: He has a normal mood and affect. His behavior is normal. Judgment and thought content normal.     Assessment/Plan This is a 26 year old male admitted for sepsis. 1. Sepsis: The patient meets criteria via tachycardia and leukocytosis.  He is hemodynamically stable. Blood cultures have been obtained which we will follow for growth and sensitivities. 2. Cellulitis: Surrounding erythema left knee. Scant amount of bursal fluid aspirated in the emergency department. Cultures pending. Continue ceftriaxone and vancomycin 3. AKI: Hydrate with intravenous fluid. Avoid nephrotoxic agents. 4. Hypokalemia: Replete potassium 5. DVT prophylaxis: Lovenox 6. GI prophylaxis: None The patient is a full code. Time spent on admission orders and patient care approximately 45 minutes  Diamond,  Michael S, MD 01/20/2017, 4:47 AM   

## 2017-01-20 NOTE — ED Notes (Signed)
Family at bedside. 

## 2017-01-20 NOTE — ED Notes (Signed)
Patient is resting comfortably. 

## 2017-01-21 LAB — C-REACTIVE PROTEIN: CRP: 9.9 mg/dL — AB (ref <1.0)

## 2017-01-21 LAB — BODY FLUID CULTURE

## 2017-01-21 LAB — RAPID HIV SCREEN (HIV 1/2 AB+AG)
HIV 1/2 Antibodies: NONREACTIVE
HIV-1 P24 ANTIGEN - HIV24: NONREACTIVE

## 2017-01-21 LAB — SEDIMENTATION RATE: Sed Rate: 45 mm/hr — ABNORMAL HIGH (ref 0–15)

## 2017-01-21 MED ORDER — CEFAZOLIN SODIUM-DEXTROSE 1-4 GM/50ML-% IV SOLN
1.0000 g | Freq: Four times a day (QID) | INTRAVENOUS | Status: DC
  Administered 2017-01-21 – 2017-01-25 (×15): 1 g via INTRAVENOUS
  Filled 2017-01-21 (×18): qty 50

## 2017-01-21 MED ORDER — CEFAZOLIN SODIUM-DEXTROSE 1-4 GM/50ML-% IV SOLN
1.0000 g | Freq: Three times a day (TID) | INTRAVENOUS | Status: DC
  Filled 2017-01-21: qty 50

## 2017-01-21 NOTE — Consult Note (Signed)
[facility] Infectious Disease     Reason for Consult:Fever, knee pain    Referring Physician: Date of Admission:  01/19/2017   Active Problems:   Sepsis [facility])   HPI: Adrian Hughes is a 26 y.o. male admitted with L knee pain and swelling. He was seen 10/2 in ED and given bactrim but developed fever so admitted. On admit wbc 16, no fever but tachycardic. Cx done in ED with MSSA. Currently on vanco and ceftraixone. He has had some decrease in pain but the area of redness has helped some.   History reviewed. No pertinent past medical history. Past Surgical History:  Procedure Laterality Date  . SURGERY SCROTAL / TESTICULAR     Social History  Substance Use Topics  . Smoking status: Never Smoker  . Smokeless tobacco: Never Used  . Alcohol use Yes     Comment: occ   History reviewed. No pertinent family history.  Allergies: No Known Allergies  Current antibiotics: Antibiotics Given (last 72 hours)    Date/Time Action Medication Dose Rate   01/19/17 2358 New Bag/Given   clindamycin (CLEOCIN) IVPB 900 mg 900 mg 100 mL/hr   01/20/17 0154 New Bag/Given   cefTRIAXone (ROCEPHIN) 1 g in dextrose 5 % 50 mL IVPB - Premix 1 g 100 mL/hr   01/20/17 0423 New Bag/Given   vancomycin (VANCOCIN) 1,500 mg in sodium chloride 0.9 % 500 mL IVPB 1,500 mg 250 mL/hr   01/20/17 1152 New Bag/Given   vancomycin (VANCOCIN) 1,250 mg in sodium chloride 0.9 % 250 mL IVPB 1,250 mg 166.7 mL/hr   01/21/17 0021 New Bag/Given   vancomycin (VANCOCIN) 1,250 mg in sodium chloride 0.9 % 250 mL IVPB 1,250 mg 166.7 mL/hr   01/21/17 0950 New Bag/Given   cefTRIAXone (ROCEPHIN) 1 g in dextrose 5 % 50 mL injection        MEDICATIONS: . docusate sodium  100 mg Oral BID  . enoxaparin (LOVENOX) injection  40 mg Subcutaneous Q24H  . ibuprofen  600 mg Oral QID   Review of Systems - 11 systems reviewed and negative per HPI  OBJECTIVE: Temp:  [98.1 F (36.7 C)-98.8 F (37.1 C)] 98.8 F (37.1 C) (10/05  0750) Pulse Rate:  [84-87] 87 (10/05 0750) Resp:  [16] 16 (10/04 2107) BP: (115-123)/(68-72) 123/72 (10/05 0750) SpO2:  [100 %] 100 % (10/05 0750) Weight:  [90.5 kg (199 lb 9.6 oz)] 90.5 kg (199 lb 9.6 oz) (10/05 0543) Physical Exam  Constitutional: He is oriented to person, place, and time. He appears well-developed and well-nourished. No distress.  HENT:  Mouth/Throat: Oropharynx is clear and moist. No oropharyngeal exudate.  Cardiovascular: Normal rate, regular rhythm and normal heart sounds. Exam reveals no gallop and no friction rub.  Pulmonary/Chest: Effort normal and breath sounds normal. No respiratory distress. He has no wheezes.  Abdominal: Soft. Bowel sounds are normal. He exhibits no distension. There is no tenderness.  Lymphadenopathy: He has no cervical adenopathy.  Neurological: He is alert and oriented to person, place, and time.  R knee with marked erythema and prepatellar swelling. Has ttp over patellar bursa. Some streaking down inner calf. Has a small scab on lower knee Skin: as above Psychiatric: He has a normal mood and affect. His behavior is normal.     LABS: Results for orders placed or performed during the hospital encounter of 01/19/17 (from the past 48 hour(s))  CBC with Differential     Status: Abnormal   Collection Time: 01/19/17  8:55 PM  Result Value Ref Range   WBC 15.6 (H) 3.8 - 10.6 K/uL   RBC 4.96 4.40 - 5.90 MIL/uL   Hemoglobin 14.5 13.0 - 18.0 g/dL   HCT 41.6 40.0 - 52.0 %   MCV 84.0 80.0 - 100.0 fL   MCH 29.3 26.0 - 34.0 pg   MCHC 34.9 32.0 - 36.0 g/dL   RDW 13.1 11.5 - 14.5 %   Platelets 169 150 - 440 K/uL   Neutrophils Relative % 82 %   Neutro Abs 12.8 (H) 1.4 - 6.5 K/uL   Lymphocytes Relative 7 %   Lymphs Abs 1.1 1.0 - 3.6 K/uL   Monocytes Relative 10 %   Monocytes Absolute 1.5 (H) 0.2 - 1.0 K/uL   Eosinophils Relative 1 %   Eosinophils Absolute 0.2 0 - 0.7 K/uL   Basophils Relative 0 %   Basophils Absolute 0.0 0 - 0.1 K/uL   Comprehensive metabolic panel     Status: Abnormal   Collection Time: 01/19/17  8:55 PM  Result Value Ref Range   Sodium 139 135 - 145 mmol/L   Potassium 3.4 (L) 3.5 - 5.1 mmol/L   Chloride 105 101 - 111 mmol/L   CO2 24 22 - 32 mmol/L   Glucose, Bld 118 (H) 65 - 99 mg/dL   BUN 12 6 - 20 mg/dL   Creatinine, Ser 1.44 (H) 0.61 - 1.24 mg/dL   Calcium 9.0 8.9 - 10.3 mg/dL   Total Protein 7.0 6.5 - 8.1 g/dL   Albumin 4.3 3.5 - 5.0 g/dL   AST 17 15 - 41 U/L   ALT 15 (L) 17 - 63 U/L   Alkaline Phosphatase 55 38 - 126 U/L   Total Bilirubin 0.8 0.3 - 1.2 mg/dL   GFR calc non Af Amer >60 >60 mL/min   GFR calc Af Amer >60 >60 mL/min    Comment: (NOTE) The eGFR has been calculated using the CKD EPI equation. This calculation has not been validated in all clinical situations. eGFR's persistently <60 mL/min signify possible Chronic Kidney Disease.    Anion gap 10 5 - 15  TSH     Status: None   Collection Time: 01/19/17  8:55 PM  Result Value Ref Range   TSH 2.098 0.350 - 4.500 uIU/mL    Comment: Performed by a 3rd Generation assay with a functional sensitivity of <=0.01 uIU/mL.  Hemoglobin A1c     Status: None   Collection Time: 01/19/17  8:55 PM  Result Value Ref Range   Hgb A1c MFr Bld 4.8 4.8 - 5.6 %    Comment: (NOTE) Pre diabetes:          5.7%-6.4% Diabetes:              >6.4% Glycemic control for   <7.0% adults with diabetes    Mean Plasma Glucose 91.06 mg/dL    Comment: Performed at [facility] [address] [place], [place] [address]  Aerobic/Anaerobic Culture (surgical/deep wound)     Status: None (Preliminary result)   Collection Time: 01/20/17 12:36 AM  Result Value Ref Range   Specimen Description LL    Special Requests NONE    Gram Stain      MODERATE WBC PRESENT, PREDOMINANTLY PMN NO ORGANISMS SEEN Performed at [facility], [place] [address] [place], [place] [address]    Culture FEW STAPHYLOCOCCUS AUREUS    Report Status PENDING    No  components found for: ESR, C REACTIVE PROTEIN MICRO:  Recent Results (from the past 720 hour(s))  Chlamydia/NGC rt PCR ([facility] only)     Status: None   Collection Time: 01/18/17  1:32 PM  Result Value Ref Range Status   Specimen source GC/Chlam URINE, RANDOM  Final   Chlamydia Tr NOT DETECTED NOT DETECTED Final   N gonorrhoeae NOT DETECTED NOT DETECTED Final    Comment: (NOTE) 100  This methodology has not been evaluated in pregnant women or in 200  patients with a history of hysterectomy. 300 400  This methodology will not be performed on patients less than 65  years of age.   Body fluid culture     Status: None (Preliminary result)   Collection Time: 01/18/17  1:50 PM  Result Value Ref Range Status   Specimen Description KNEE LEFT KNEE  Final   Special Requests NONE  Final   Gram Stain   Final    MODERATE WBC PRESENT,BOTH PMN AND MONONUCLEAR NO ORGANISMS SEEN    Culture   Final    FEW STAPHYLOCOCCUS AUREUS Results Called to: AJuline Patch, RN [facility]) ABOUT CULTURE GROWTH AT 1540 ON 01/20/17 BY C. JESSUP, MLT. Performed at [facility], [place] [address], [place], [place] [address]    Report Status PENDING  Incomplete   Organism ID, Bacteria STAPHYLOCOCCUS AUREUS  Final      Susceptibility   Staphylococcus aureus - MIC*    CIPROFLOXACIN <=0.5 SENSITIVE Sensitive     ERYTHROMYCIN <=0.25 SENSITIVE Sensitive     GENTAMICIN <=0.5 SENSITIVE Sensitive     OXACILLIN 0.5 SENSITIVE Sensitive     TETRACYCLINE <=1 SENSITIVE Sensitive     VANCOMYCIN 1 SENSITIVE Sensitive     TRIMETH/SULFA <=10 SENSITIVE Sensitive     CLINDAMYCIN <=0.25 SENSITIVE Sensitive     RIFAMPIN <=0.5 SENSITIVE Sensitive     Inducible Clindamycin NEGATIVE Sensitive     * FEW STAPHYLOCOCCUS AUREUS  Aerobic/Anaerobic Culture (surgical/deep wound)     Status: None (Preliminary result)   Collection Time: 01/20/17 12:36 AM  Result Value Ref Range Status   Specimen Description LL  Final   Special Requests NONE  Final    Gram Stain   Final    MODERATE WBC PRESENT, PREDOMINANTLY PMN NO ORGANISMS SEEN Performed at [facility], [address], [place], [place] [address]    Culture FEW STAPHYLOCOCCUS AUREUS  Final   Report Status PENDING  Incomplete    IMAGING: Dg Knee Complete 4 Views Left  Result Date: 01/18/2017 CLINICAL DATA:  Knee pain and swelling. Limited weight-bearing. No acute injury. EXAM: LEFT KNEE - COMPLETE 4+ VIEW COMPARISON:  None. FINDINGS: The mineralization and alignment are normal. There is no evidence of acute fracture or dislocation. The joint spaces are maintained. There is no significant knee joint effusion. There is prepatellar soft tissue swelling without evidence of foreign body or soft tissue emphysema. IMPRESSION: Prepatellar soft tissue swelling suggesting prepatellar bursitis. No acute osseous findings. Electronically Signed   By: Richardean Sale M.D.   On: 01/18/2017 12:52    Assessment:   DEANTE BLOUGH is a 26 y.o. male previously healthy admitted with L prepatellar bursitis for 5 days with MSSA on culture. He had been on bactrim as outpatient with worsening. Some improvement in pain but still swollen. At this point uncertain if he will need surgery or repeated attempts at aspiration (he has had 3 already) or if he will respond to abx.    Recommendations Change to ancef Would follow closely and  take to OR if worsens or plateaus. Can continue IV abx while inpatient but at dc can send on oral keflex likely for a 2 week course with follow up. Thank you very much for allowing me to participate in the care of this patient. Please call with questions.   Cheral Marker. Ola Spurr, MD

## 2017-01-21 NOTE — Progress Notes (Signed)
[facility] - [place] at [facility]   PATIENT NAME: Adrian Hughes    MR#:  [id number]  DATE OF BIRTH:  09-Mar-1991  Patient presented with left knee redness swelling and pain for 3 days.  Left knee joint redness increasing beyond the marking. No fever. Pain present. Patient having some headache. REVIEW OF SYSTEMS:   Review of Systems  Constitutional: Positive for fever. Negative for chills and weight loss.  HENT: Negative for ear discharge, ear pain and nosebleeds.   Eyes: Negative for blurred vision, pain and discharge.  Respiratory: Negative for sputum production, shortness of breath, wheezing and stridor.   Cardiovascular: Negative for chest pain, palpitations, orthopnea and PND.  Gastrointestinal: Negative for abdominal pain, diarrhea, nausea and vomiting.  Genitourinary: Negative for frequency and urgency.  Musculoskeletal: Positive for joint pain. Negative for back pain.  Neurological: Negative for sensory change, speech change, focal weakness and weakness.  Psychiatric/Behavioral: Negative for depression and hallucinations. The patient is not nervous/anxious.    Tolerating Diet:yes Tolerating PT:  Not needed  DRUG ALLERGIES:  No Known Allergies  VITALS:  Blood pressure 123/72, pulse 87, temperature 98.8 F (37.1 C), temperature source Oral, resp. rate 16, height  (1.854 m), weight 90.5 kg (199 lb 9.6 oz), SpO2 100 %.  PHYSICAL EXAMINATION:   Physical Exam  GENERAL:  26 y.o.-year-old patient lying in the bed with no acute distress.  EYES: Pupils equal, round, reactive to light and accommodation. No scleral icterus. Extraocular muscles intact.  HEENT: Head atraumatic, normocephalic. Oropharynx and nasopharynx clear.  NECK:  Supple, no jugular venous distention. No thyroid enlargement, no tenderness.  LUNGS: Normal breath sounds bilaterally, no wheezing, rales, rhonchi. No use of accessory muscles of respiration.  CARDIOVASCULAR: S1, S2 normal.  No murmurs, rubs, or gallops.  ABDOMEN: Soft, nontender, nondistended. Bowel sounds present. No organomegaly or mass.  EXTREMITIES: No cyanosis, clubbing or edema b/l.   Left knee cellutitis NEUROLOGIC: Cranial nerves II through XII are intact. No focal Motor or sensory deficits b/l.   PSYCHIATRIC:  patient is alert and oriented x 3.  SKIN: No obvious rash, lesion, or ulcer.   LABORATORY PANEL:  CBC  Recent Labs Lab 01/19/17 2055  WBC 15.6*  HGB 14.5  HCT 41.6  PLT 169    Chemistries   Recent Labs Lab 01/19/17 2055  NA 139  K 3.4*  CL 105  CO2 24  GLUCOSE 118*  BUN 12  CREATININE 1.44*  CALCIUM 9.0  AST 17  ALT 15*  ALKPHOS 55  BILITOT 0.8   Cardiac Enzymes No results for input(s): TROPONINI in the last 168 hours. RADIOLOGY:  No results found. ASSESSMENT AND PLAN:  26 year old male admitted for sepsis.  1. Sepsis: The patient meets criteria via tachycardia and leukocytosis. He is hemodynamically stable. Blood cultures have been obtained which we will follow for growth and sensitivities.  2. Left Knee Cellulitis: Surrounding erythema left knee. Scant amount of bursal fluid aspirated in the emergency department. Cultures Positive for pansensitive staph aureus  - pt was on ceftriaxone and vancomycin----changed to IV cefazolin per Dr. Sampson Goon  -Xray knee shows prepatellar effusion -Dbowers input noted -ibuprofen and prn toradol -pt not tolerating po narcotics  3. AKI: Hydrate with intravenous fluid. Avoid nephrotoxic agents. -watch creat  4. Hypokalemia: Replete potassium  5. DVT prophylaxis: Lovenox  Case discussed with Care Management/Social Worker. Management plans discussed with the patient, family and they are in agreement.  CODE STATUS: full  DVT Prophylaxis: lovenox  TOTAL TIME TAKING CARE OF THIS PATIENT: *40* minutes.  >50% time spent on counselling and coordination of care  POSSIBLE D/C IN *2* DAYS, DEPENDING ON CLINICAL  CONDITION.  Note: This dictation was prepared with Dragon dictation along with smaller phrase technology. Any transcriptional errors that result from this process are unintentional.  [name] M.D on 01/21/2017 at 3:18 PM  Between 7am to 6pm - Pager - [phone]  After 6pm go to [email or web address] - password [email or web address]  [facility]  Office  [phone]  CC: Primary care physician; [name], MDPatient ID: Adrian Hughes, male   DOB: Dec 17, 1990, 26 y.o.   MRN: [id number]

## 2017-01-21 NOTE — Progress Notes (Signed)
Orders to hold IV ABX until seen by Dr. Sampson Goon.

## 2017-01-21 NOTE — Progress Notes (Signed)
Pharmacy Antibiotic Note  Adrian Hughes is a 26 y.o. male admitted on 01/19/2017 with sepsis cellulitis/bursitis arthritis. Cultures from 10/2 showing MSSA.   Pharmacy has been consulted for cefazolin dosing   Plan: Will start cefazolin 1g IV every 6  hours.   Height:  (185.4 cm) Weight: 199 lb 9.6 oz (90.5 kg) IBW/kg (Calculated) : 79.9  Temp (24hrs), Avg:98.4 F (36.9 C), Min:98.1 F (36.7 C), Max:98.8 F (37.1 C)   Recent Labs Lab 01/18/17 1138 01/19/17 2055  WBC 16.8* 15.6*  CREATININE 1.28* 1.44*    Estimated Creatinine Clearance: 86.3 mL/min (A) (by C-G formula based on SCr of 1.44 mg/dL (H)).    No Known Allergies  Antimicrobials this admission: Ceftriaxone, vancomycin 10/4  >> 10/5 Cefazolin 10/5 >>  Dose adjustments this admission:  Microbiology results: 10/2 knee aspirate: few S. aureus  Thank you for allowing pharmacy to be a part of this patient's care.  Gardner Candle, PharmD, BCPS Clinical Pharmacist 01/21/2017 2:10 PM

## 2017-01-22 LAB — CBC
HCT: 38 % — ABNORMAL LOW (ref 40.0–52.0)
Hemoglobin: 13.4 g/dL (ref 13.0–18.0)
MCH: 29.4 pg (ref 26.0–34.0)
MCHC: 35.2 g/dL (ref 32.0–36.0)
MCV: 83.7 fL (ref 80.0–100.0)
Platelets: 199 10*3/uL (ref 150–440)
RBC: 4.54 MIL/uL (ref 4.40–5.90)
RDW: 13.1 % (ref 11.5–14.5)
WBC: 9.2 10*3/uL (ref 3.8–10.6)

## 2017-01-22 LAB — CREATININE, SERUM
CREATININE: 1.3 mg/dL — AB (ref 0.61–1.24)
GFR calc Af Amer: >60 mL/min (ref >60)

## 2017-01-22 MED ORDER — IBUPROFEN 400 MG PO TABS
600.0000 mg | ORAL_TABLET | Freq: Four times a day (QID) | ORAL | Status: DC | PRN
  Administered 2017-01-22 – 2017-01-23 (×2): 600 mg via ORAL
  Filled 2017-01-22 (×2): qty 2

## 2017-01-22 MED ORDER — KETOROLAC TROMETHAMINE 30 MG/ML IJ SOLN
15.0000 mg | Freq: Three times a day (TID) | INTRAMUSCULAR | Status: DC | PRN
  Administered 2017-01-22 – 2017-01-24 (×2): 15 mg via INTRAVENOUS
  Filled 2017-01-22 (×2): qty 1

## 2017-01-22 NOTE — Progress Notes (Signed)
Patient has rested quietly this shift with spouse/significant other at bedside most of the day. Mild pain relieved by scheduled ibuprofen. Patient aware of possibility of I&D of bursa tomorrow pending eval by Dr. Odis Luster in the morning. No complaints. VSS.

## 2017-01-22 NOTE — Progress Notes (Signed)
  Subjective:  Patient reports pain as mild.  Objective:   VITALS:   Vitals:   01/21/17 0543 01/21/17 0750 01/21/17 1943 01/22/17 0755  BP:  123/72 120/72 115/67  Pulse:  87 92 91  Resp:   18 18  Temp:  98.8 F (37.1 C) 98.2 F (36.8 C) 98.5 F (36.9 C)  TempSrc:  Oral Oral Oral  SpO2:  100% 99% 98%  Weight: 90.5 kg (199 lb 9.6 oz)     Height:        PHYSICAL EXAM:  ABD soft Sensation intact distally Intact pulses distally Dorsiflexion/Plantar flexion intact Compartment soft erythema diminished, tender along the anterior knee  No pain with active or passive knee motion +fluctuance over anterior knee, 3 cm in diameter  LABS  Results for orders placed or performed during the hospital encounter of 01/19/17 (from the past 24 hour(s))  Sedimentation rate     Status: Abnormal   Collection Time: 01/21/17  3:53 PM  Result Value Ref Range   Sed Rate 45 (H) 0 - 15 mm/hr  C-reactive protein     Status: Abnormal   Collection Time: 01/21/17  3:53 PM  Result Value Ref Range   CRP 9.9 (H) <1.0 mg/dL  Rapid HIV screen (HIV 1/2 Ab+Ag)     Status: None   Collection Time: 01/21/17  3:53 PM  Result Value Ref Range   HIV-1 P24 Antigen - HIV24 NON REACTIVE NON REACTIVE   HIV 1/2 Antibodies NON REACTIVE NON REACTIVE   Interpretation (HIV Ag Ab)      A non reactive test result means that HIV 1 or HIV 2 antibodies and HIV 1 p24 antigen were not detected in the specimen.  Creatinine, serum     Status: Abnormal   Collection Time: 01/21/17 11:40 PM  Result Value Ref Range   Creatinine, Ser 1.30 (H) 0.61 - 1.24 mg/dL   GFR calc non Af Amer >60 >60 mL/min   GFR calc Af Amer >60 >60 mL/min  CBC     Status: Abnormal   Collection Time: 01/22/17  4:28 AM  Result Value Ref Range   WBC 9.2 3.8 - 10.6 K/uL   RBC 4.54 4.40 - 5.90 MIL/uL   Hemoglobin 13.4 13.0 - 18.0 g/dL   HCT 16.1 (L) 09.6 - 04.5 %   MCV 83.7 80.0 - 100.0 fL   MCH 29.4 26.0 - 34.0 pg   MCHC 35.2 32.0 - 36.0 g/dL   RDW  40.9 81.1 - 91.4 %   Platelets 199 150 - 440 K/uL    No results found.  Assessment/Plan:     Active Problems:   Sepsis (HCC)   He does appear to be improving on the cefazolin, however there is a small fluid collection over the anterior knee. We discussed the possibility of I&D of the bursa with either primary, secondary or VAC closure. He will be NPO after midnight, but the final decision will be made in the morning based on his exam.   Lyndle Herrlich , MD 01/22/2017, 11:50 AM

## 2017-01-22 NOTE — Progress Notes (Signed)
Pt was concerned about the redness on his left knee that has extended a bit on the lower thigh. Ace wrap removed and assessed, new redness formation marked and dated by this Clinical research associate.

## 2017-01-22 NOTE — Progress Notes (Signed)
[facility] - [place] at [facility]   PATIENT NAME: Adrian Hughes    MR#:  [id number]  DATE OF BIRTH:  06/24/1990  Patient presented with left knee redness swelling and pain for 3 days.  Still has redness and some swelling. Afebrile. REVIEW OF SYSTEMS:   Review of Systems  Constitutional: Positive for fever. Negative for chills and weight loss.  HENT: Negative for ear discharge, ear pain and nosebleeds.   Eyes: Negative for blurred vision, pain and discharge.  Respiratory: Negative for sputum production, shortness of breath, wheezing and stridor.   Cardiovascular: Negative for chest pain, palpitations, orthopnea and PND.  Gastrointestinal: Negative for abdominal pain, diarrhea, nausea and vomiting.  Genitourinary: Negative for frequency and urgency.  Musculoskeletal: Positive for joint pain. Negative for back pain.  Neurological: Negative for sensory change, speech change, focal weakness and weakness.  Psychiatric/Behavioral: Negative for depression and hallucinations. The patient is not nervous/anxious.    Tolerating Diet:yes  DRUG ALLERGIES:  No Known Allergies  VITALS:  Blood pressure 115/67, pulse 91, temperature 98.5 F (36.9 C), temperature source Oral, resp. rate 18, height  (1.854 m), weight 90.5 kg (199 lb 9.6 oz), SpO2 98 %.  PHYSICAL EXAMINATION:   Physical Exam  GENERAL:  26 y.o.-year-old patient lying in the bed with no acute distress.  EYES: Pupils equal, round, reactive to light and accommodation. No scleral icterus. Extraocular muscles intact.  HEENT: Head atraumatic, normocephalic. Oropharynx and nasopharynx clear.  NECK:  Supple, no jugular venous distention. No thyroid enlargement, no tenderness.  LUNGS: Normal breath sounds bilaterally, no wheezing, rales, rhonchi. No use of accessory muscles of respiration.  CARDIOVASCULAR: S1, S2 normal. No murmurs, rubs, or gallops.  ABDOMEN: Soft, nontender, nondistended. Bowel sounds  present. No organomegaly or mass.  EXTREMITIES: No cyanosis, clubbing or edema b/l.   Left knee cellutitis. Fluctuance NEUROLOGIC: Cranial nerves II through XII are intact. No focal Motor or sensory deficits b/l.   PSYCHIATRIC:  patient is alert and oriented x 3.  SKIN: No obvious rash, lesion, or ulcer.   LABORATORY PANEL:  CBC  Recent Labs Lab 01/22/17 0428  WBC 9.2  HGB 13.4  HCT 38.0*  PLT 199    Chemistries   Recent Labs Lab 01/19/17 2055 01/21/17 2340  NA 139  --   K 3.4*  --   CL 105  --   CO2 24  --   GLUCOSE 118*  --   BUN 12  --   CREATININE 1.44* 1.30*  CALCIUM 9.0  --   AST 17  --   ALT 15*  --   ALKPHOS 55  --   BILITOT 0.8  --    Cardiac Enzymes No results for input(s): TROPONINI in the last 168 hours. RADIOLOGY:  No results found. ASSESSMENT AND PLAN:  26 year old male admitted for sepsis.  * Sepsis with Left Knee Cellulitis/Prepatellar bursitis: Surrounding erythema left knee. Scant amount of bursal fluid aspirated in the emergency department. Cultures Positive for pansensitive staph aureus  -  IV cefazolin per Dr. Sampson Goon  -Xray knee shows prepatellar effusion -Discussed with Dr. Odis Luster  -ibuprofen and prn toradol - Likely or tomorrow  * AKI: Hydrate with intravenous fluid. Avoid nephrotoxic agents. Improved  * Hypokalemia: Replete potassium  * DVT prophylaxis: Lovenox  Case discussed with Care Management/Social Worker. Management plans discussed with the patient, family and they are in agreement.  CODE STATUS: full  DVT Prophylaxis: lovenox  [facility]seeARE OF THIS PATIENT:  30 minutes.   POSSIBLE D/C IN 1-2 DAYS, DEPENDING ON CLINICAL CONDITION   Note: This dictation was prepared with Dragon dictation along with smaller phrase technology. Any transcriptional errors that result from this process are unintentional.  Milagros Loll R M.D on 01/22/2017 at 12:27 PM  Between 7am to 6pm - Pager - [phone]  After 6pm go  to [email or web address] - password [email or web address]  [facility]  Office  [phone]  CC: Primary care physician; [name], MDPatient ID: Karena Addison, male   DOB: August 07, 1990, 26 y.o.   MRN: [id number]

## 2017-01-22 NOTE — Progress Notes (Signed)
Per Dr. Odis Luster, ACE wrap applied to knee. Ok if patient wants to use ice pack.

## 2017-01-23 ENCOUNTER — Encounter
Admission: EM | Disposition: A | Discharge status: Home / Self Care | Payer: Self-pay | Source: Home / Self Care | Attending: Internal Medicine

## 2017-01-23 LAB — CBC WITH DIFFERENTIAL/PLATELET
Basophils Absolute: 0.1 10*3/uL (ref 0–0.1)
Basophils Relative: 1 %
EOS ABS: 0.4 10*3/uL (ref 0–0.7)
EOS PCT: 5 %
HCT: 36.3 % — ABNORMAL LOW (ref 40.0–52.0)
HEMOGLOBIN: 13 g/dL (ref 13.0–18.0)
LYMPHS ABS: 1.9 10*3/uL (ref 1.0–3.6)
Lymphocytes Relative: 21 %
MCH: 29.7 pg (ref 26.0–34.0)
MCHC: 35.7 g/dL (ref 32.0–36.0)
MCV: 83.2 fL (ref 80.0–100.0)
MONOS PCT: 11 %
Monocytes Absolute: 1 10*3/uL (ref 0.2–1.0)
NEUTROS PCT: 64 %
Neutro Abs: 5.8 10*3/uL (ref 1.4–6.5)
Platelets: 211 10*3/uL (ref 150–440)
RBC: 4.37 MIL/uL — ABNORMAL LOW (ref 4.40–5.90)
RDW: 13 % (ref 11.5–14.5)
WBC: 9.2 10*3/uL (ref 3.8–10.6)

## 2017-01-23 LAB — BASIC METABOLIC PANEL
Anion gap: 7 (ref 5–15)
BUN: 15 mg/dL (ref 6–20)
CHLORIDE: 108 mmol/L (ref 101–111)
CO2: 25 mmol/L (ref 22–32)
CREATININE: 1.35 mg/dL — AB (ref 0.61–1.24)
Calcium: 8.8 mg/dL — ABNORMAL LOW (ref 8.9–10.3)
GFR calc Af Amer: >60 mL/min (ref >60)
GFR calc non Af Amer: >60 mL/min (ref >60)
GLUCOSE: 102 mg/dL — AB (ref 65–99)
Potassium: 3.5 mmol/L (ref 3.5–5.1)
SODIUM: 140 mmol/L (ref 135–145)

## 2017-01-23 SURGERY — IRRIGATION AND DEBRIDEMENT KNEE
Anesthesia: General | Laterality: Left | Wound class: Dirty or Infected

## 2017-01-23 SURGICAL SUPPLY — 25 items
BANDAGE ELASTIC 4 LF NS (GAUZE/BANDAGES/DRESSINGS) ×3 IMPLANT
BANDAGE ELASTIC 6 CLIP NS LF (GAUZE/BANDAGES/DRESSINGS) ×3 IMPLANT
BNDG ESMARK 4X12 TAN STRL LF (GAUZE/BANDAGES/DRESSINGS) ×3 IMPLANT
CANISTER SUCT 1200ML W/VALVE (MISCELLANEOUS) ×3 IMPLANT
CAST PADDING 3X4FT ST 30246 (SOFTGOODS) ×4
CHLORAPREP W/TINT 26ML (MISCELLANEOUS) ×3 IMPLANT
CUFF TOURN 30 STER DUAL PORT (MISCELLANEOUS) IMPLANT
CUFF TOURN SGL QUICK 24 (TOURNIQUET CUFF)
CUFF TRNQT CYL 24X4X40X1 (TOURNIQUET CUFF) IMPLANT
ELECT REM PT RETURN 9FT ADLT (ELECTROSURGICAL) ×3
ELECTRODE REM PT RTRN 9FT ADLT (ELECTROSURGICAL) ×1 IMPLANT
GAUZE PETRO XEROFOAM 1X8 (MISCELLANEOUS) ×3 IMPLANT
GAUZE SPONGE 4X4 12PLY STRL (GAUZE/BANDAGES/DRESSINGS) ×3 IMPLANT
GLOVE BIO SURGEON STRL SZ8 (GLOVE) ×3 IMPLANT
GOWN STRL REUS W/ TWL LRG LVL3 (GOWN DISPOSABLE) ×1 IMPLANT
GOWN STRL REUS W/TWL LRG LVL3 (GOWN DISPOSABLE) ×2
GOWN STRL REUS W/TWL LRG LVL4 (GOWN DISPOSABLE) ×3 IMPLANT
KIT RM TURNOVER STRD PROC AR (KITS) ×3 IMPLANT
NS IRRIG 1000ML POUR BTL (IV SOLUTION) ×3 IMPLANT
PACK EXTREMITY ARMC (MISCELLANEOUS) ×3 IMPLANT
PAD CAST CTTN 3X4 STRL (SOFTGOODS) ×2 IMPLANT
PULSAVAC PLUS IRRIG FAN TIP (DISPOSABLE)
STOCKINETTE STRL 4IN 9604848 (GAUZE/BANDAGES/DRESSINGS) ×3 IMPLANT
SUT ETHILON 4 0 P 3 18 (SUTURE) IMPLANT
TIP FAN IRRIG PULSAVAC PLUS (DISPOSABLE) IMPLANT

## 2017-01-23 NOTE — Progress Notes (Signed)
Pt has been refusing Lovenox. Pt is up and about independently, seen walking in the room and on the hall with wife by his side. Education given but pt continues to refuse.

## 2017-01-23 NOTE — Progress Notes (Signed)
  Subjective:  Patient reports pain as mild.  Feels better today.  Objective:   VITALS:   Vitals:   01/22/17 1445 01/22/17 1944 01/23/17 0545 01/23/17 0911  BP: 129/72 130/79  123/75  Pulse: 83 74  69  Resp:  16  16  Temp: 98.4 F (36.9 C) 98.1 F (36.7 C)  97.8 F (36.6 C)  TempSrc: Oral Oral  Oral  SpO2: 96% 99%  98%  Weight:   91.1 kg (200 lb 12.8 oz)   Height:        PHYSICAL EXAM:  Sensation intact distally Intact pulses distally Dorsiflexion/Plantar flexion intact Compartment soft decreased warmth, decreased erythema, decreased swelling, decreased streaking  LABS  Results for orders placed or performed during the hospital encounter of 01/19/17 (from the past 24 hour(s))  CBC with Differential/Platelet     Status: Abnormal   Collection Time: 01/23/17  4:31 AM  Result Value Ref Range   WBC 9.2 3.8 - 10.6 K/uL   RBC 4.37 (L) 4.40 - 5.90 MIL/uL   Hemoglobin 13.0 13.0 - 18.0 g/dL   HCT 16.1 (L) 09.6 - 04.5 %   MCV 83.2 80.0 - 100.0 fL   MCH 29.7 26.0 - 34.0 pg   MCHC 35.7 32.0 - 36.0 g/dL   RDW 40.9 81.1 - 91.4 %   Platelets 211 150 - 440 K/uL   Neutrophils Relative % 64 %   Neutro Abs 5.8 1.4 - 6.5 K/uL   Lymphocytes Relative 21 %   Lymphs Abs 1.9 1.0 - 3.6 K/uL   Monocytes Relative 11 %   Monocytes Absolute 1.0 0.2 - 1.0 K/uL   Eosinophils Relative 5 %   Eosinophils Absolute 0.4 0 - 0.7 K/uL   Basophils Relative 1 %   Basophils Absolute 0.1 0 - 0.1 K/uL  Basic metabolic panel     Status: Abnormal   Collection Time: 01/23/17  4:31 AM  Result Value Ref Range   Sodium 140 135 - 145 mmol/L   Potassium 3.5 3.5 - 5.1 mmol/L   Chloride 108 101 - 111 mmol/L   CO2 25 22 - 32 mmol/L   Glucose, Bld 102 (H) 65 - 99 mg/dL   BUN 15 6 - 20 mg/dL   Creatinine, Ser 7.82 (H) 0.61 - 1.24 mg/dL   Calcium 8.8 (L) 8.9 - 10.3 mg/dL   GFR calc non Af Amer >60 >60 mL/min   GFR calc Af Amer >60 >60 mL/min   Anion gap 7 5 - 15    No results  found.  Assessment/Plan: Day of Surgery   Active Problems:   Sepsis (HCC)   Advance diet  Patient shows significant improvement today. Will hold off on surgery for now. Will make him NPO again tonight in case he worsens, but likely will be ready for discharge tomorrow with home IV antibiotics. Will discuss with Dr. Glena Norfolk prior to discharge.  Lyndle Herrlich , MD 01/23/2017, 9:49 AM

## 2017-01-23 NOTE — Progress Notes (Signed)
[facility] - [place] [facility]   PATIENT NAME: Adrian Hughes    MR#:  [id number]  DATE OF BIRTH:  13-Nov-1990  Patient presented with left knee redness swelling and pain for 3 days.   Still has redness and some swelling. Afebrile. Up and ambulating REVIEW OF SYSTEMS:   Review of Systems  Constitutional: Positive for fever. Negative for chills and weight loss.  HENT: Negative for ear discharge, ear pain and nosebleeds.   Eyes: Negative for blurred vision, pain and discharge.  Respiratory: Negative for sputum production, shortness of breath, wheezing and stridor.   Cardiovascular: Negative for chest pain, palpitations, orthopnea and PND.  Gastrointestinal: Negative for abdominal pain, diarrhea, nausea and vomiting.  Genitourinary: Negative for frequency and urgency.  Musculoskeletal: Positive for joint pain. Negative for back pain.  Neurological: Negative for sensory change, speech change, focal weakness and weakness.  Psychiatric/Behavioral: Negative for depression and hallucinations. The patient is not nervous/anxious.    Tolerating Diet:yes  DRUG ALLERGIES:  No Known Allergies  VITALS:  Blood pressure 123/75, pulse 69, temperature 97.8 F (36.6 C), temperature source Oral, resp. rate 16, height  (1.854 m), weight 91.1 kg (200 lb 12.8 oz), SpO2 98 %.  PHYSICAL EXAMINATION:   Physical Exam  GENERAL:  26 y.o.-year-old patient lying in the bed with no acute distress.  EYES: Pupils equal, round, reactive to light and accommodation. No scleral icterus. Extraocular muscles intact.  HEENT: Head atraumatic, normocephalic. Oropharynx and nasopharynx clear.  NECK:  Supple, no jugular venous distention. No thyroid enlargement, no tenderness.  LUNGS: Normal breath sounds bilaterally, no wheezing, rales, rhonchi. No use of accessory muscles of respiration.  CARDIOVASCULAR: S1, S2 normal. No murmurs, rubs, or gallops.  ABDOMEN: Soft, nontender,  nondistended. Bowel sounds present. No organomegaly or mass.  EXTREMITIES: No cyanosis, clubbing or edema b/l.   Left knee cellutitis. Fluctuance NEUROLOGIC: Cranial nerves II through XII are intact. No focal Motor or sensory deficits b/l.   PSYCHIATRIC:  patient is alert and oriented x 3.  SKIN: No obvious rash, lesion, or ulcer.   LABORATORY PANEL:  CBC  Recent Labs Lab 01/23/17 0431  WBC 9.2  HGB 13.0  HCT 36.3*  PLT 211    Chemistries   Recent Labs Lab 01/19/17 2055  01/23/17 0431  NA 139  --  140  K 3.4*  --  3.5  CL 105  --  108  CO2 24  --  25  GLUCOSE 118*  --  102*  BUN 12  --  15  CREATININE 1.44*  < > 1.35*  CALCIUM 9.0  --  8.8*  AST 17  --   --   ALT 15*  --   --   ALKPHOS 55  --   --   BILITOT 0.8  --   --   < > = values in this interval not displayed. Cardiac Enzymes No results for input(s): TROPONINI in the last 168 hours. RADIOLOGY:  No results found. ASSESSMENT AND PLAN:  26 year old male admitted for sepsis.  * Sepsis with Left Knee Cellulitis/Prepatellar bursitis: Surrounding erythema left knee. Scant amount of bursal fluid aspirated in the emergency department. Cultures Positive for pansensitive staph aureus  -  IV cefazolin -Xray knee shows prepatellar effusion -Discussed with Dr. Odis Luster  - NPO after midnight. OR if no improvement -ibuprofen and prn toradol  * AKI: Hydrate with intravenous fluid. Avoid nephrotoxic agents. Improved  * Hypokalemia: Replete potassium  * DVT prophylaxis: Lovenox  Case discussed with Care Management/Social Worker. Management plans discussed with the patient, family and they are in agreement.  CODE STATUS: full  DVT Prophylaxis: lovenox  TOTAL TIME TAKING CARE OF THIS PATIENT: 30 minutes.   POSSIBLE D/C IN 1-2 DAYS, DEPENDING ON CLINICAL CONDITION   Note: This dictation was prepared with Dragon dictation along with smaller phrase technology. Any transcriptional errors that result from this process  are unintentional.  Milagros Loll R M.D on 01/23/2017 at 12:55 PM  Between 7am to 6pm - Pager - [phone]  After 6pm go to [email or web address] - password [email or web address]  [facility] [place] [facility]  Office  [phone]  CC: Primary care physician; [name], MDPatient ID: Adrian Hughes, male   DOB: [birth year], 26 y.o.   MRN: [id number]

## 2017-01-24 ENCOUNTER — Inpatient Hospital Stay: Payer: BLUE CROSS/BLUE SHIELD | Admitting: Anesthesiology

## 2017-01-24 ENCOUNTER — Encounter
Admission: EM | Disposition: A | Discharge status: Home / Self Care | Payer: Self-pay | Source: Home / Self Care | Attending: Internal Medicine

## 2017-01-24 ENCOUNTER — Encounter: Payer: Self-pay | Admitting: *Deleted

## 2017-01-24 HISTORY — PX: INCISION AND DRAINAGE: SHX5863

## 2017-01-24 SURGERY — INCISION AND DRAINAGE
Anesthesia: Choice | Laterality: Left

## 2017-01-24 MED ORDER — PROMETHAZINE HCL 25 MG/ML IJ SOLN
12.5000 mg | Freq: Once | INTRAMUSCULAR | Status: AC
  Administered 2017-01-24: 12.5 mg via INTRAVENOUS

## 2017-01-24 MED ORDER — LACTATED RINGERS IV SOLN
INTRAVENOUS | Status: DC | PRN
  Administered 2017-01-24: 13:00:00 via INTRAVENOUS

## 2017-01-24 MED ORDER — FENTANYL CITRATE (PF) 100 MCG/2ML IJ SOLN
INTRAMUSCULAR | Status: AC
  Filled 2017-01-24: qty 2

## 2017-01-24 MED ORDER — SODIUM CHLORIDE FLUSH 0.9 % IV SOLN
INTRAVENOUS | Status: AC
  Administered 2017-01-24: 9 mL
  Filled 2017-01-24: qty 10

## 2017-01-24 MED ORDER — BACITRACIN 50000 UNITS IM SOLR
INTRAMUSCULAR | Status: AC
  Filled 2017-01-24: qty 1

## 2017-01-24 MED ORDER — SODIUM CHLORIDE 0.9 % IR SOLN
Status: DC | PRN
  Administered 2017-01-24: 30 mL

## 2017-01-24 MED ORDER — CEPHALEXIN 500 MG PO CAPS: 500.0000 mg | ORAL_CAPSULE | Freq: Four times a day (QID) | ORAL | 0 refills | Status: DC

## 2017-01-24 MED ORDER — FENTANYL CITRATE (PF) 100 MCG/2ML IJ SOLN
INTRAMUSCULAR | Status: DC | PRN
  Administered 2017-01-24: 50 ug via INTRAVENOUS
  Administered 2017-01-24: 75 ug via INTRAVENOUS
  Administered 2017-01-24: 25 ug via INTRAVENOUS
  Administered 2017-01-24: 50 ug via INTRAVENOUS

## 2017-01-24 MED ORDER — MIDAZOLAM HCL 2 MG/2ML IJ SOLN
INTRAMUSCULAR | Status: DC | PRN
  Administered 2017-01-24: 2 mg via INTRAVENOUS

## 2017-01-24 MED ORDER — MIDAZOLAM HCL 2 MG/2ML IJ SOLN
INTRAMUSCULAR | Status: AC
  Filled 2017-01-24: qty 2

## 2017-01-24 MED ORDER — ONDANSETRON HCL 4 MG/2ML IJ SOLN
INTRAMUSCULAR | Status: AC
  Filled 2017-01-24: qty 2

## 2017-01-24 MED ORDER — METOCLOPRAMIDE HCL 10 MG PO TABS: 5.0000 mg | ORAL_TABLET | Freq: Three times a day (TID) | ORAL | Status: DC | PRN

## 2017-01-24 MED ORDER — ONDANSETRON HCL 4 MG/2ML IJ SOLN
INTRAMUSCULAR | Status: DC | PRN
  Administered 2017-01-24: 4 mg via INTRAVENOUS

## 2017-01-24 MED ORDER — DEXAMETHASONE SODIUM PHOSPHATE 10 MG/ML IJ SOLN
INTRAMUSCULAR | Status: AC
  Filled 2017-01-24: qty 1

## 2017-01-24 MED ORDER — SODIUM CHLORIDE 0.9 % IJ SOLN
INTRAMUSCULAR | Status: AC
  Filled 2017-01-24: qty 10

## 2017-01-24 MED ORDER — FENTANYL CITRATE (PF) 100 MCG/2ML IJ SOLN
INTRAMUSCULAR | Status: AC
  Administered 2017-01-24: 25 ug via INTRAVENOUS
  Filled 2017-01-24: qty 2

## 2017-01-24 MED ORDER — BACITRACIN 50000 UNITS IM SOLR
INTRAMUSCULAR | Status: AC
  Filled 2017-01-24: qty 2

## 2017-01-24 MED ORDER — LACTATED RINGERS IV SOLN
INTRAVENOUS | Status: DC
  Administered 2017-01-24: 18:00:00 via INTRAVENOUS

## 2017-01-24 MED ORDER — PROMETHAZINE HCL 25 MG/ML IJ SOLN
INTRAMUSCULAR | Status: AC
  Administered 2017-01-24: 12.5 mg via INTRAVENOUS
  Filled 2017-01-24: qty 1

## 2017-01-24 MED ORDER — HYDROCODONE-ACETAMINOPHEN 5-325 MG PO TABS
1.0000 | ORAL_TABLET | ORAL | Status: DC | PRN
  Administered 2017-01-24 – 2017-01-25 (×2): 1 via ORAL
  Filled 2017-01-24 (×2): qty 1

## 2017-01-24 MED ORDER — PROPOFOL 10 MG/ML IV BOLUS
INTRAVENOUS | Status: DC | PRN
  Administered 2017-01-24: 200 mg via INTRAVENOUS

## 2017-01-24 MED ORDER — ONDANSETRON HCL 4 MG/2ML IJ SOLN: 4.0000 mg | Freq: Four times a day (QID) | INTRAMUSCULAR | Status: DC | PRN

## 2017-01-24 MED ORDER — BUPIVACAINE HCL (PF) 0.25 % IJ SOLN
INTRAMUSCULAR | Status: AC
  Filled 2017-01-24: qty 30

## 2017-01-24 MED ORDER — FENTANYL CITRATE (PF) 100 MCG/2ML IJ SOLN
25.0000 ug | INTRAMUSCULAR | Status: AC | PRN
  Administered 2017-01-24 (×6): 25 ug via INTRAVENOUS

## 2017-01-24 MED ORDER — DEXAMETHASONE SODIUM PHOSPHATE 10 MG/ML IJ SOLN
INTRAMUSCULAR | Status: DC | PRN
  Administered 2017-01-24: 10 mg via INTRAVENOUS

## 2017-01-24 MED ORDER — ONDANSETRON HCL 4 MG PO TABS: 4.0000 mg | ORAL_TABLET | Freq: Four times a day (QID) | ORAL | Status: DC | PRN

## 2017-01-24 MED ORDER — PROPOFOL 10 MG/ML IV BOLUS
INTRAVENOUS | Status: AC
  Filled 2017-01-24: qty 20

## 2017-01-24 MED ORDER — METOCLOPRAMIDE HCL 5 MG/ML IJ SOLN: 5.0000 mg | Freq: Three times a day (TID) | INTRAMUSCULAR | Status: DC | PRN

## 2017-01-24 MED ORDER — SODIUM CHLORIDE 0.9 % IJ SOLN
INTRAMUSCULAR | Status: AC
  Filled 2017-01-24: qty 20

## 2017-01-24 MED ORDER — BUPIVACAINE HCL 0.25 % IJ SOLN
INTRAMUSCULAR | Status: DC | PRN
  Administered 2017-01-24: 10 mL

## 2017-01-24 SURGICAL SUPPLY — 37 items
BNDG COHESIVE 4X5 TAN STRL (GAUZE/BANDAGES/DRESSINGS) ×3 IMPLANT
BNDG GAUZE 4.5X4.1 6PLY STRL (MISCELLANEOUS) ×3 IMPLANT
BRUSH SCRUB EZ  4% CHG (MISCELLANEOUS) ×4
BRUSH SCRUB EZ 4% CHG (MISCELLANEOUS) ×2 IMPLANT
CANISTER SUCT 1200ML W/VALVE (MISCELLANEOUS) ×3 IMPLANT
CANISTER SUCT 3000ML PPV (MISCELLANEOUS) ×3 IMPLANT
CHLORAPREP W/TINT 26ML (MISCELLANEOUS) ×6 IMPLANT
DRAPE INCISE IOBAN 66X60 STRL (DRAPES) ×3 IMPLANT
DRAPE SHEET LG 3/4 BI-LAMINATE (DRAPES) IMPLANT
DRAPE SURG 17X11 SM STRL (DRAPES) IMPLANT
DRAPE U-SHAPE 47X51 STRL (DRAPES) IMPLANT
ELECT REM PT RETURN 9FT ADLT (ELECTROSURGICAL) ×3
ELECTRODE REM PT RTRN 9FT ADLT (ELECTROSURGICAL) ×1 IMPLANT
GAUZE SPONGE 4X4 12PLY STRL (GAUZE/BANDAGES/DRESSINGS) ×3 IMPLANT
GAUZE XEROFORM 4X4 STRL (GAUZE/BANDAGES/DRESSINGS) ×3 IMPLANT
GLOVE INDICATOR 8.0 STRL GRN (GLOVE) ×3 IMPLANT
GLOVE SURG ORTHO 8.0 STRL STRW (GLOVE) ×3 IMPLANT
GOWN STRL REUS W/ TWL LRG LVL3 (GOWN DISPOSABLE) ×1 IMPLANT
GOWN STRL REUS W/ TWL XL LVL3 (GOWN DISPOSABLE) ×1 IMPLANT
GOWN STRL REUS W/TWL LRG LVL3 (GOWN DISPOSABLE) ×2
GOWN STRL REUS W/TWL XL LVL3 (GOWN DISPOSABLE) ×2
HANDLE YANKAUER SUCT BULB TIP (MISCELLANEOUS) ×3 IMPLANT
KIT RM TURNOVER STRD PROC AR (KITS) ×3 IMPLANT
NS IRRIG 1000ML POUR BTL (IV SOLUTION) ×3 IMPLANT
PACK EXTREMITY ARMC (MISCELLANEOUS) ×3 IMPLANT
PAD ABD DERMACEA PRESS 5X9 (GAUZE/BANDAGES/DRESSINGS) ×6 IMPLANT
SPONGE LAP 18X18 5 PK (GAUZE/BANDAGES/DRESSINGS) ×3 IMPLANT
STAPLER SKIN PROX 35W (STAPLE) ×3 IMPLANT
STOCKINETTE IMPERVIOUS 9X36 MD (GAUZE/BANDAGES/DRESSINGS) ×3 IMPLANT
SUT ETHILON 2 0 FSLX (SUTURE) ×6 IMPLANT
SUT ETHILON NAB PS2 4-0 18IN (SUTURE) IMPLANT
SUT PDS AB 2-0 CT1 27 (SUTURE) IMPLANT
SUT VIC AB 1 CT1 36 (SUTURE) IMPLANT
SUT VIC AB 2-0 CT1 27 (SUTURE) ×2
SUT VIC AB 2-0 CT1 TAPERPNT 27 (SUTURE) ×1 IMPLANT
SUT VICRYL AB 3-0 FS1 BRD 27IN (SUTURE) IMPLANT
TOWEL OR 17X26 4PK STRL BLUE (TOWEL DISPOSABLE) ×3 IMPLANT

## 2017-01-24 NOTE — Progress Notes (Signed)
  Subjective:  Patient reports pain as mild.  Objective:   VITALS:   Vitals:   01/23/17 1512 01/23/17 1950 01/24/17 0648 01/24/17 0749  BP: 136/79 134/69  126/69  Pulse: 84 89  68  Resp: 20 16    Temp: 98 F (36.7 C) 98.8 F (37.1 C)  98.3 F (36.8 C)  TempSrc: Oral Oral  Oral  SpO2: 99% 98%  100%  Weight:   90.9 kg (200 lb 6.4 oz)   Height:        PHYSICAL EXAM:  Sensation intact distally Intact pulses distally Dorsiflexion/Plantar flexion intact Compartment soft decreased erythema along thigh, increased swelling, erythema and tenderness over anterior knee  LABS  No results found for this or any previous visit (from the past 24 hour(s)).  No results found.  Assessment/Plan: Prepatellar bursitis, infected   Active Problems:   Sepsis (HCC)   The fluid collection has increased significantly and patient is more tender over anterior knee. I did recommend incision and drainage of the prepatellar bursa. Will plan to do this today. Patient could be discharged after the procedure if medically stable and antibiotic choice and duration are known.   Lyndle Herrlich , MD 01/24/2017, 8:50 AM

## 2017-01-24 NOTE — H&P (Signed)
The patient has been re-examined, and the chart reviewed, and there have been no interval changes to the documented history and physical.  Plan a left knee prepatellar bursa irrigation and debridement today.  Anesthesia is not consulted regarding a peripheral nerve block for post-operative pain.  The risks, benefits, and alternatives have been discussed at length, and the patient is willing to proceed.

## 2017-01-24 NOTE — Transfer of Care (Signed)
Immediate Anesthesia Transfer of Care Note  Patient: Adrian Hughes  Procedure(s) Performed: INCISION AND DRAINAGE (Left )  Patient Location: PACU  Anesthesia Type:General  Level of Consciousness: sedated and responds to stimulation  Airway & Oxygen Therapy: Patient Spontanous Breathing and Patient connected to face mask oxygen  Post-op Assessment: Report given to RN and Post -op Vital signs reviewed and stable  Post vital signs: Reviewed and stable  Last Vitals:  Vitals:   01/24/17 1225 01/24/17 1428  BP: 134/74 (!) 104/55  Pulse: 78 83  Resp: 18 15  Temp: 36.7 C (!) 36.3 C  SpO2: 100%     Last Pain:  Vitals:   01/24/17 0749  TempSrc: Oral  PainSc:       Patients Stated Pain Goal: 1 (01/23/17 2203)  Complications: No apparent anesthesia complications

## 2017-01-24 NOTE — Progress Notes (Signed)
[facility]  25  BUN  --   --  15  CREATININE 1.30*  --  1.35*    Microbiology: Results for orders placed or performed during the hospital encounter of 01/19/17  Aerobic/Anaerobic Culture (surgical/deep wound)     Status: None (Preliminary result)   Collection Time: 01/20/17 12:36 AM  Result Value Ref Range Status   Specimen Description LL  Final   Special Requests NONE  Final   Gram Stain   Final    MODERATE WBC PRESENT, PREDOMINANTLY PMN NO ORGANISMS SEEN Performed at [facility], [address] [place], [place] [address]    Culture   Final    FEW STAPHYLOCOCCUS AUREUS NO ANAEROBES ISOLATED; CULTURE IN PROGRESS FOR 5 DAYS    Report Status PENDING  Incomplete   Organism ID, Bacteria STAPHYLOCOCCUS AUREUS  Final      Susceptibility   Staphylococcus aureus - MIC*    CIPROFLOXACIN <=0.5 SENSITIVE Sensitive     ERYTHROMYCIN <=0.25 SENSITIVE Sensitive     GENTAMICIN <=0.5 SENSITIVE Sensitive     OXACILLIN 0.5 SENSITIVE Sensitive     TETRACYCLINE <=1 SENSITIVE Sensitive     VANCOMYCIN <=0.5 SENSITIVE  Sensitive     TRIMETH/SULFA <=10 SENSITIVE Sensitive     CLINDAMYCIN <=0.25 SENSITIVE Sensitive     RIFAMPIN <=0.5 SENSITIVE Sensitive     Inducible Clindamycin NEGATIVE Sensitive     * FEW STAPHYLOCOCCUS AUREUS    Studies/Results: No results found.  Assessment/Plan: Adrian Hughes is a 26 y.o. male previously healthy admitted with L prepatellar bursitis for 5 days with MSSA on culture. He had been on bactrim as outpatient with worsening. He had I and D 10/8 with 20 cc pus. CLosed primarily.  Recommendations Cont ancef while inpatient At dc can send home on oral keflex 500 QID for 10 days post op with close fu with ortho and with me if necessary Thank you very much for the consult. Will follow with you.  Mick Sell   01/24/2017, 3:28 PM

## 2017-01-24 NOTE — Anesthesia Post-op Follow-up Note (Signed)
Anesthesia QCDR form completed.        

## 2017-01-24 NOTE — Op Note (Signed)
01/19/2017 - 01/24/2017  2:29 PM  PATIENT:  Adrian Hughes  26 y.o. male  PRE-OPERATIVE DIAGNOSIS:  LEFT KNEE infected bursa  POST-OPERATIVE DIAGNOSIS:  LEFT KNEE infected pre-patellar bursa  PROCEDURE:  Procedure(s): INCISION AND DRAINAGE (Left)  SURGEON:  Surgeon(s) and Role:    Lyndle Herrlich, MD - Primary  PHYSICIAN ASSISTANT:   ASSISTANTS: none   ANESTHESIA:   general  EBL:  Total I/O In: 400 [I.V.:400] Out: - 50 ml  TT: 8 minutes at 250 mmHg  BLOOD ADMINISTERED:none  DRAINS: none   SPECIMEN:  Source of Specimen:  left knee bursal fluid  COUNTS:  YES  TOURNIQUET:   Total Tourniquet Time Documented: Thigh (Left) - 8 minutes Total: Thigh (Left) - 8 minutes   DICTATION: After informed consent was obtained the patient was taken to the operating room and placed in the supine position. General anesthesia was induced and the left lower was prepped and draped in standard sterile fashion. The leg was elevated and the tourniquet insufflated to 250 mmHg. A longitudinal, lateral incision approximately 7 cm in length was carried out. About 20 ml of frank pus was encountered in the prepatellar space. The bursa and fluid was excised sharply by scalpel and scissor. The wound was irrigated with over 3 L of bacitracin laced normal saline. The incision was closed with 2-0 Vicryl and 2-0 Nylon. A sterile dressing was applied. He tolerated the procedure well and was taken to the PACU in good condition.  PLAN OF CARE: Admit to inpatient   PATIENT DISPOSITION:  PACU - hemodynamically stable.

## 2017-01-24 NOTE — Progress Notes (Deleted)
[facility]         [place], [place].   01/24/2017  Patient: Adrian Hughes   Date of Birth:  1990/09/01  Date of admission:  01/19/2017  Date of Discharge  01/24/2017    To Whom it May Concern:   [name]  may return to work on 01/31/2017.   If you have any questions or concerns, please don't hesitate to call.  Sincerely,   Milagros Loll R M.D Office : [phone]   .

## 2017-01-24 NOTE — Progress Notes (Signed)
Pharmacy Antibiotic Note  Adrian Hughes is a 26 y.o. male admitted on 01/19/2017 with sepsis cellulitis/bursitis arthritis. Cultures from 10/2 showing MSSA.   Pharmacy has been consulted for cefazolin dosing   Plan: Will continue cefazolin 1g IV every 6  hours.   Height:  (185.4 cm) Weight: 200 lb 6.4 oz (90.9 kg) IBW/kg (Calculated) : 79.9  Temp (24hrs), Avg:98.3 F (36.8 C), Min:98 F (36.7 C), Max:98.8 F (37.1 C)   Recent Labs Lab 01/18/17 1138 01/19/17 2055 01/21/17 2340 01/22/17 0428 01/23/17 0431  WBC 16.8* 15.6*  --  9.2 9.2  CREATININE 1.28* 1.44* 1.30*  --  1.35*    Estimated Creatinine Clearance: 92.1 mL/min (A) (by C-G formula based on SCr of 1.35 mg/dL (H)).    No Known Allergies  Antimicrobials this admission: Ceftriaxone, vancomycin 10/4  >> 10/5 Cefazolin 10/5 >>  Dose adjustments this admission:  Microbiology results: 10/2 knee aspirate: MSSA  Thank you for allowing pharmacy to be a part of this patient's care.  Valentina Gu, PharmD, BCPS Clinical Pharmacist 01/24/2017 1:00 PM

## 2017-01-24 NOTE — Anesthesia Procedure Notes (Signed)
Procedure Name: LMA Insertion Date/Time: 01/24/2017 1:33 PM Performed by: Omer Jack Pre-anesthesia Checklist: Patient identified, Patient being monitored, Timeout performed, Emergency Drugs available and Suction available Patient Re-evaluated:Patient Re-evaluated prior to induction Oxygen Delivery Method: Circle system utilized Preoxygenation: Pre-oxygenation with 100% oxygen Induction Type: IV induction Ventilation: Mask ventilation without difficulty LMA: LMA inserted LMA Size: 4.0 Tube type: Oral Number of attempts: 1 Placement Confirmation: positive ETCO2 and breath sounds checked- equal and bilateral Tube secured with: Tape Dental Injury: Teeth and Oropharynx as per pre-operative assessment

## 2017-01-24 NOTE — Discharge Instructions (Signed)
Resume diet as before. ° ° °

## 2017-01-25 ENCOUNTER — Encounter: Payer: Self-pay | Admitting: Orthopedic Surgery

## 2017-01-25 LAB — AEROBIC/ANAEROBIC CULTURE W GRAM STAIN (SURGICAL/DEEP WOUND)

## 2017-01-25 LAB — AEROBIC/ANAEROBIC CULTURE (SURGICAL/DEEP WOUND)

## 2017-01-25 MED ORDER — HYDROCODONE-ACETAMINOPHEN 5-325 MG PO TABS: 1.0000 | ORAL_TABLET | Freq: Four times a day (QID) | ORAL | 0 refills | Status: DC | PRN

## 2017-01-25 MED ORDER — CEPHALEXIN 500 MG PO CAPS: 500.0000 mg | ORAL_CAPSULE | Freq: Four times a day (QID) | ORAL | 0 refills | Status: AC

## 2017-01-25 NOTE — Progress Notes (Signed)
  Subjective:  Patient reports pain as mild.    Objective:   VITALS:   Vitals:   01/24/17 2300 01/25/17 0404 01/25/17 0418 01/25/17 0809  BP: 125/66 124/66  133/73  Pulse: 89 88  92  Resp: 18 18    Temp: 98.7 F (37.1 C) 98.8 F (37.1 C)  98.3 F (36.8 C)  TempSrc: Oral Oral  Oral  SpO2: 96% 99%  100%  Weight:   90 kg (198 lb 6.4 oz)   Height:        PHYSICAL EXAM:  Sensation intact distally Intact pulses distally Dorsiflexion/Plantar flexion intact Incision: dressing C/D/I Compartment soft  LABS  No results found for this or any previous visit (from the past 24 hour(s)).  No results found.  Assessment/Plan: 1 Day Post-Op   Active Problems:   Sepsis (HCC)   Advance diet  Plan discharge on oral antibiotics today if medically stable. Patient to see me in the office on Thursday for dressing change and wound check. Please call with questions. He is WBAT.   Lyndle Herrlich , MD 01/25/2017, 8:18 AM

## 2017-01-25 NOTE — Progress Notes (Signed)
[facility] - [place] at [facility]   PATIENT NAME: Adrian Hughes    MR#:  [id number]  DATE OF BIRTH:  November 09, 1990  Patient presented with left knee redness swelling and pain for 3 days.   Still has redness. Swelling worse. REVIEW OF SYSTEMS:   Review of Systems  Constitutional: Positive for fever. Negative for chills and weight loss.  HENT: Negative for ear discharge, ear pain and nosebleeds.   Eyes: Negative for blurred vision, pain and discharge.  Respiratory: Negative for sputum production, shortness of breath, wheezing and stridor.   Cardiovascular: Negative for chest pain, palpitations, orthopnea and PND.  Gastrointestinal: Negative for abdominal pain, diarrhea, nausea and vomiting.  Genitourinary: Negative for frequency and urgency.  Musculoskeletal: Positive for joint pain. Negative for back pain.  Neurological: Negative for sensory change, speech change, focal weakness and weakness.  Psychiatric/Behavioral: Negative for depression and hallucinations. The patient is not nervous/anxious.    Tolerating Diet:yes  DRUG ALLERGIES:  No Known Allergies  VITALS:  Blood pressure 124/66, pulse 88, temperature 98.8 F (37.1 C), temperature source Oral, resp. rate 18, height  (1.854 m), weight 90 kg (198 lb 6.4 oz), SpO2 99 %.  PHYSICAL EXAMINATION:   Physical Exam  GENERAL:  26 y.o.-year-old patient lying in the bed with no acute distress.  EYES: Pupils equal, round, reactive to light and accommodation. No scleral icterus. Extraocular muscles intact.  HEENT: Head atraumatic, normocephalic. Oropharynx and nasopharynx clear.  NECK:  Supple, no jugular venous distention. No thyroid enlargement, no tenderness.  LUNGS: Normal breath sounds bilaterally, no wheezing, rales, rhonchi. No use of accessory muscles of respiration.  CARDIOVASCULAR: S1, S2 normal. No murmurs, rubs, or gallops.  ABDOMEN: Soft, nontender, nondistended. Bowel sounds present. No  organomegaly or mass.  EXTREMITIES: No cyanosis, clubbing or edema b/l.   Left knee cellutitis. Fluctuance NEUROLOGIC: Cranial nerves II through XII are intact. No focal Motor or sensory deficits b/l.   PSYCHIATRIC:  patient is alert and oriented x 3.  SKIN: No obvious rash, lesion, or ulcer.   LABORATORY PANEL:  CBC  Recent Labs Lab 01/23/17 0431  WBC 9.2  HGB 13.0  HCT 36.3*  PLT 211    Chemistries   Recent Labs Lab 01/19/17 2055  01/23/17 0431  NA 139  --  140  K 3.4*  --  3.5  CL 105  --  108  CO2 24  --  25  GLUCOSE 118*  --  102*  BUN 12  --  15  CREATININE 1.44*  < > 1.35*  CALCIUM 9.0  --  8.8*  AST 17  --   --   ALT 15*  --   --   ALKPHOS 55  --   --   BILITOT 0.8  --   --   < > = values in this interval not displayed. Cardiac Enzymes No results for input(s): TROPONINI in the last 168 hours. RADIOLOGY:  No results found. ASSESSMENT AND PLAN:  26 year old male admitted for sepsis.  * Sepsis with Left Knee Cellulitis/Prepatellar bursitis: Surrounding erythema left knee. Scant amount of bursal fluid aspirated in the emergency department. Cultures Positive for pansensitive staph aureus  -  IV cefazolin -Xray knee shows prepatellar effusion -Discussed with Dr. Odis Luster  -ibuprofen and prn toradol - s/p InD today. Patient very nauseaous. And monitor overnight. D/c in AM  * AKI: Hydrate with intravenous fluid. Avoid nephrotoxic agents. Improved  * Hypokalemia: Replete potassium  * DVT prophylaxis: Lovenox  Case discussed with Care Management/Social Worker. Management plans discussed with the patient, family and they are in agreement.  CODE STATUS: full  DVT Prophylaxis: lovenox  TOTAL TIME TAKING CARE OF THIS PATIENT: 30 minutes.   POSSIBLE D/C IN 1-2 DAYS, DEPENDING ON CLINICAL CONDITION   Note: This dictation was prepared with Dragon dictation along with smaller phrase technology. Any transcriptional errors that result from this process are  unintentional.  Milagros Loll R M.D on 01/25/2017 at 6:33 AM  Between 7am to 6pm - Pager - [phone]  After 6pm go to [email or web address] - password [email or web address]  [facility]  Office  [phone]  CC: Primary care physician; [name], MDPatient ID: Adrian Hughes, male   DOB: 1991/03/30, 26 y.o.   MRN: [id number]

## 2017-01-25 NOTE — Progress Notes (Signed)
[facility]         [place], [place].   01/25/2017  Patient: Adrian Hughes   Date of Birth:  [birth year]  Date of admission:  01/19/2017  Date of Discharge  01/25/2017    To Whom it May Concern:   Virgal Warmuth  may return to work on 02/01/2017.   If you have any questions or concerns, please don't hesitate to call.  Sincerely,   Milagros Loll R M.D Office : [phone]   .

## 2017-01-25 NOTE — Care Management (Signed)
Walker ordered and delivered by [facility] 10/8

## 2017-01-25 NOTE — Progress Notes (Signed)
Discharge summary reviewed with verbal understanding. Answered all questions. F/U appt made for [facility]. 2 Rxs given (1 narcotic) upon discharge. Escorted to personal vehicle via wc by ortho staff.

## 2017-01-26 NOTE — Discharge Summary (Signed)
[facility] - [place] at [facility]   PATIENT NAME: Adrian Hughes    MR#:  [id number]  DATE OF BIRTH:  09/27/1990  DATE OF ADMISSION:  01/19/2017 ADMITTING PHYSICIAN: Arnaldo Natal, MD  DATE OF DISCHARGE: 01/25/2017 11:40 AM  PRIMARY CARE PHYSICIAN: [name], MD   ADMISSION DIAGNOSIS:  Prepatellar bursitis of left knee [M70.42]  DISCHARGE DIAGNOSIS:  Active Problems:   Sepsis (HCC)   SECONDARY DIAGNOSIS:  History reviewed. No pertinent past medical history.   ADMITTING HISTORY  Chief Complaint: Knee pain HPI: The patient. No chronic medical illnesses presents to the emergency department for the second time in his many days complaining of left knee pain. The patient was diagnosed with bursitis on his first emergency department visit. He was given Bactrim but developed subjective fever as well as nausea. The patient has had 1 episode of nonbloody nonbilious emesis. He returns tonight for evaluation was found to have elevated white blood cell count as well as tachycardia. He was given ceftriaxone and clindamycin in the emergency department prior to the hospitalist service being called for admission.  HOSPITAL COURSE:   25 year old male admitted for sepsis.  * Sepsis with Left Knee Cellulitis/Prepatellar bursitis: Surrounding erythema left knee. Scant amount of bursal fluid aspirated in the emergency department. Cultures Positive for pansensitive staph aureus  -  IV cefazolin -Xray knee shows prepatellar effusion -Discussed with Dr. Odis Luster  -ibuprofen and prn toradol - s/p InD .  Pain and redness improved well. Discussed with Dr. Odis Luster of orthopedics. Patient discharged home on Keflex for 10 more days. Follow-up with him in 1 week.  * AKI: Hydrate with intravenous fluid. Avoid nephrotoxic agents. Improved  * Hypokalemia: Repleted potassium  Stable for discharge home. Given a prescription for a walker.  CONSULTS OBTAINED:  Treatment Team:   Lyndle Herrlich, MD Mick Sell, MD  DRUG ALLERGIES:  No Known Allergies  DISCHARGE MEDICATIONS:   Discharge Medication List as of 01/25/2017  9:41 AM    START taking these medications   Details  HYDROcodone-acetaminophen (NORCO/VICODIN) 5-325 MG tablet Take 1 tablet by mouth every 6 (six) hours as needed for severe pain (breakthrough pain)., Starting Tue 01/25/2017, Print      CONTINUE these medications which have CHANGED   Details  cephALEXin (KEFLEX) 500 MG capsule Take 1 capsule (500 mg total) by mouth 4 (four) times daily., Starting Tue 01/25/2017, Until Tue 02/08/2017, Print      CONTINUE these medications which have NOT CHANGED   Details  ibuprofen (ADVIL,MOTRIN) 600 MG tablet Take 1 tablet (600 mg total) by mouth every 6 (six) hours as needed., Starting Tue 01/18/2017, Print      STOP taking these medications     oxyCODONE-acetaminophen (ROXICET) 5-325 MG tablet      sulfamethoxazole-trimethoprim (BACTRIM DS,SEPTRA DS) 800-160 MG tablet         Today   VITAL SIGNS:  Blood pressure 133/73, pulse 92, temperature 98.3 F (36.8 C), temperature source Oral, resp. rate 18, height  (1.854 m), weight 90 kg (198 lb 6.4 oz), SpO2 100 %.  I/O:  No intake or output data in the 24 hours ending 01/26/17 1535  PHYSICAL EXAMINATION:  Physical Exam  GENERAL:  25 y.o.-year-old patient lying in the bed with no acute distress.  LUNGS: Normal breath sounds bilaterally, no wheezing, rales,rhonchi or crepitation. No use of accessory muscles of respiration.  CARDIOVASCULAR: S1, S2 normal. No murmurs, rubs, or gallops.  ABDOMEN: Soft, non-tender, non-distended.  Bowel sounds present. No organomegaly or mass.  NEUROLOGIC: Moves all 4 extremities. PSYCHIATRIC: The patient is alert and oriented x 3.  SKIN: Dressing over her left knee  DATA REVIEW:   CBC  Recent Labs Lab 01/23/17 0431  WBC 9.2  HGB 13.0  HCT 36.3*  PLT 211    Chemistries   Recent Labs Lab  01/19/17 2055  01/23/17 0431  NA 139  --  140  K 3.4*  --  3.5  CL 105  --  108  CO2 24  --  25  GLUCOSE 118*  --  102*  BUN 12  --  15  CREATININE 1.44*  < > 1.35*  CALCIUM 9.0  --  8.8*  AST 17  --   --   ALT 15*  --   --   ALKPHOS 55  --   --   BILITOT 0.8  --   --   < > = values in this interval not displayed.  Cardiac Enzymes No results for input(s): TROPONINI in the last 168 hours.  Microbiology Results  Results for orders placed or performed during the hospital encounter of 01/19/17  Aerobic/Anaerobic Culture (surgical/deep wound)     Status: None   Collection Time: 01/20/17 12:36 AM  Result Value Ref Range Status   Specimen Description LL  Final   Special Requests NONE  Final   Gram Stain   Final    MODERATE WBC PRESENT, PREDOMINANTLY PMN NO ORGANISMS SEEN    Culture   Final    FEW STAPHYLOCOCCUS AUREUS NO ANAEROBES ISOLATED Performed at [facility], [address], [place], [place] [address]    Report Status 01/25/2017 FINAL  Final   Organism ID, Bacteria STAPHYLOCOCCUS AUREUS  Final      Susceptibility   Staphylococcus aureus - MIC*    CIPROFLOXACIN <=0.5 SENSITIVE Sensitive     ERYTHROMYCIN <=0.25 SENSITIVE Sensitive     GENTAMICIN <=0.5 SENSITIVE Sensitive     OXACILLIN 0.5 SENSITIVE Sensitive     TETRACYCLINE <=1 SENSITIVE Sensitive     VANCOMYCIN <=0.5 SENSITIVE Sensitive     TRIMETH/SULFA <=10 SENSITIVE Sensitive     CLINDAMYCIN <=0.25 SENSITIVE Sensitive     RIFAMPIN <=0.5 SENSITIVE Sensitive     Inducible Clindamycin NEGATIVE Sensitive     * FEW STAPHYLOCOCCUS AUREUS  Aerobic/Anaerobic Culture (surgical/deep wound)     Status: None (Preliminary result)   Collection Time: 01/24/17  1:41 PM  Result Value Ref Range Status   Specimen Description KNEE LEFT KNEE  Final   Special Requests NONE  Final   Gram Stain   Final    FEW WBC PRESENT, PREDOMINANTLY PMN NO ORGANISMS SEEN    Culture   Final    FEW STAPHYLOCOCCUS  AUREUS SUSCEPTIBILITIES TO FOLLOW Performed at [facility], [address] [place], [place] [address]    Report Status PENDING  Incomplete    RADIOLOGY:  No results found.  Follow up with PCP in 1 week.  Management plans discussed with the patient, family and they are in agreement.  CODE STATUS:  Code Status History    Date Active Date Inactive Code Status Order ID Comments User Context   01/20/2017  2:59 AM 01/25/2017  2:45 PM Full Code [id number]  Arnaldo Natal, MD ED      TOTAL TIME TAKING CARE OF THIS PATIENT ON DAY OF DISCHARGE: more than 30 minutes.   Milagros Loll R M.D on 01/26/2017 at 3:35 PM  Between 7am to 6pm - Pager - [phone]  After 6pm go to [email or web address] - password [email or web address] [facility]  [facility]  Office  [phone]  CC: Primary care physician; [name], MD  Note: This dictation was prepared with Dragon dictation along with smaller phrase technology. Any transcriptional errors that result from this process are unintentional.

## 2017-01-27 NOTE — Anesthesia Postprocedure Evaluation (Signed)
Anesthesia Post Note  Patient: Adrian Hughes  Procedure(s) Performed: INCISION AND DRAINAGE (Left )  Patient location during evaluation: PACU Anesthesia Type: General Level of consciousness: awake and alert Pain management: pain level controlled Vital Signs Assessment: post-procedure vital signs reviewed and stable Respiratory status: spontaneous breathing, nonlabored ventilation, respiratory function stable and patient connected to nasal cannula oxygen Cardiovascular status: blood pressure returned to baseline and stable Postop Assessment: no apparent nausea or vomiting Anesthetic complications: no     Last Vitals:  Vitals:   01/25/17 0404 01/25/17 0809  BP: 124/66 133/73  Pulse: 88 92  Resp: 18   Temp: 37.1 C 36.8 C  SpO2: 99% 100%    Last Pain:  Vitals:   01/25/17 0947  TempSrc:   PainSc: 3                  Yevette Edwards

## 2017-01-27 NOTE — Anesthesia Preprocedure Evaluation (Signed)
Anesthesia Evaluation  Patient identified by MRN, date of birth, ID band Patient awake    Reviewed: Allergy & Precautions, H&P , NPO status , Patient's Chart, lab work & pertinent test results, reviewed documented beta blocker date and time   Airway Mallampati: II   Neck ROM: full    Dental  (+) Poor Dentition   Pulmonary neg pulmonary ROS,    Pulmonary exam normal        Cardiovascular negative cardio ROS Normal cardiovascular exam Rhythm:regular Rate:Normal     Neuro/Psych negative neurological ROS  negative psych ROS   GI/Hepatic negative GI ROS, Neg liver ROS,   Endo/Other  negative endocrine ROS  Renal/GU negative Renal ROS  negative genitourinary   Musculoskeletal   Abdominal   Peds  Hematology negative hematology ROS (+)   Anesthesia Other Findings History reviewed. No pertinent past medical history. Past Surgical History: 01/24/2017: INCISION AND DRAINAGE; Left     Comment:  Procedure: INCISION AND DRAINAGE;  Surgeon: Lyndle Herrlich, MD;  Location: [facility] ORS;  Service: Orthopedics;               Laterality: Left; No date: SURGERY SCROTAL / TESTICULAR BMI    Body Mass Index:  26.18 kg/m     Reproductive/Obstetrics negative OB ROS                             Anesthesia Physical Anesthesia Plan  ASA: II  Anesthesia Plan: General   Post-op Pain Management:    Induction:   PONV Risk Score and Plan: 4 or greater and Ondansetron, Dexamethasone, Midazolam and Propofol infusion  Airway Management Planned:   Additional Equipment:   Intra-op Plan:   Post-operative Plan:   Informed Consent: I have reviewed the patients History and Physical, chart, labs and discussed the procedure including the risks, benefits and alternatives for the proposed anesthesia with the patient or authorized representative who has indicated his/her understanding and acceptance.    Dental Advisory Given  Plan Discussed with: CRNA  Anesthesia Plan Comments:         Anesthesia Quick Evaluation

## 2017-02-01 LAB — SUSCEPTIBILITY, AER + ANAEROB

## 2017-02-01 LAB — SUSCEPTIBILITY RESULT

## 2017-02-03 LAB — AEROBIC/ANAEROBIC CULTURE W GRAM STAIN (SURGICAL/DEEP WOUND)

## 2017-02-03 LAB — AEROBIC/ANAEROBIC CULTURE (SURGICAL/DEEP WOUND)

## 2017-02-10 ENCOUNTER — Encounter: Payer: Self-pay | Admitting: Infectious Diseases

## 2017-02-10 DIAGNOSIS — M7040 Prepatellar bursitis, unspecified knee: Secondary | ICD-10-CM | POA: Insufficient documentation

## 2018-04-18 ENCOUNTER — Other Ambulatory Visit: Payer: Self-pay | Admitting: Emergency Medicine

## 2018-04-18 DIAGNOSIS — R1011 Right upper quadrant pain: Secondary | ICD-10-CM

## 2018-04-24 ENCOUNTER — Ambulatory Visit
Admission: RE | Admit: 2018-04-24 | Discharge: 2018-04-24 | Disposition: A | Discharge status: Home / Self Care | Payer: BLUE CROSS/BLUE SHIELD | Source: Ambulatory Visit | Attending: Emergency Medicine | Admitting: Emergency Medicine

## 2018-04-24 DIAGNOSIS — R1011 Right upper quadrant pain: Secondary | ICD-10-CM | POA: Diagnosis present

## 2018-05-01 ENCOUNTER — Encounter: Payer: Self-pay | Admitting: Gastroenterology

## 2018-05-01 ENCOUNTER — Ambulatory Visit: Payer: BLUE CROSS/BLUE SHIELD | Admitting: Gastroenterology

## 2018-05-01 VITALS — BP 107/72 | HR 87 | Ht 73.0 in | Wt 221.6 lb

## 2018-05-01 DIAGNOSIS — K219 Gastro-esophageal reflux disease without esophagitis: Secondary | ICD-10-CM | POA: Diagnosis not present

## 2018-05-01 DIAGNOSIS — R5383 Other fatigue: Secondary | ICD-10-CM | POA: Diagnosis not present

## 2018-05-01 DIAGNOSIS — R1013 Epigastric pain: Secondary | ICD-10-CM | POA: Diagnosis not present

## 2018-05-01 DIAGNOSIS — G8929 Other chronic pain: Secondary | ICD-10-CM

## 2018-05-01 MED ORDER — OMEPRAZOLE 40 MG PO CPDR: 40.0000 mg | DELAYED_RELEASE_CAPSULE | Freq: Every day | ORAL | 3 refills | Status: DC

## 2018-05-01 NOTE — Progress Notes (Signed)
[name] MD, MRCP(U.K) [address]  [address]  [place], [place] [address]  Main: [phone]  Fax: [phone]   Gastroenterology Consultation  Referring Provider:     Eather Colas Primary Care Physician:  [name], MD Primary Gastroenterologist:  Dr. [name]  Reason for Consultation:     Abdominal pain         HPI:   Adrian Hughes is a 28 y.o. y/o male referred for consultation & management  by Dr. [name], MD.     04/24/2018 : RUQ USG- normal.   Acid reflux  6-7 months- feeling of burning in throat , regurgitation , all day long, ongoing , worse after he eats. No recent weight gain , does wake up in the night with sensation of acid in the throat. Has taken TUMS which works occasionally. Has his dinner at 6-8 pm , goes to bed by 10 pm , in between is sitting up .Not affecting his life.   Feels like a pressure on the upper part of the abdomen- 1.5 to 2 months, RUQ pain on and off , after he eats. Occurring for the same duration , steadily getting worse. Usually begins almost immediately and at times may be after an hour, lasts a few hours, also has a pain below right shoulder blade. Np pain while exercising . Pain relieved by TUMS . Usually nothing.   Denies use of NSAID's. No daily use of alcohol use .   He has a  Bowel movement soft , not hard . No difference after a bowel movement , denies any stress. Was on Pepcid which didn't help.   Complains of snoring and some fatigue.  No past medical history on file.  Past Surgical History:  Procedure Laterality Date  . INCISION AND DRAINAGE Left 01/24/2017   Procedure: INCISION AND DRAINAGE;  Surgeon: Lyndle Herrlich, MD;  Location: [facility] ORS;  Service: Orthopedics;  Laterality: Left;  . SURGERY SCROTAL / TESTICULAR      Prior to Admission medications   Medication Sig Start Date End Date Taking? Authorizing Provider  famotidine (PEPCID) 10 MG tablet Take 10 mg by mouth 2 (two)  times daily.   Yes [provider]  sucralfate (CARAFATE) 1 GM/10ML suspension Take 1 g by mouth 4 (four) times daily -  with meals and at bedtime.   Yes [provider]  HYDROcodone-acetaminophen (NORCO/VICODIN) 5-325 MG tablet Take 1 tablet by mouth every 6 (six) hours as needed for severe pain (breakthrough pain). Patient not taking: Reported on 05/01/2018 01/25/17   Milagros Loll, MD  ibuprofen (ADVIL,MOTRIN) 600 MG tablet Take 1 tablet (600 mg total) by mouth every 6 (six) hours as needed. Patient not taking: Reported on 05/01/2018 01/18/17   Nita Sickle, MD    No family history on file.   Social History   Tobacco Use  . Smoking status: Never Smoker  . Smokeless tobacco: Never Used  Substance Use Topics  . Alcohol use: Yes    Comment: occ  . Drug use: No    Allergies as of 05/01/2018  . (No Known Allergies)    Review of Systems:    All systems reviewed and negative except where noted in HPI.   Physical Exam:  BP 107/72   Pulse 87   Ht 6\' 1"  (1.854 m)   Wt 221 lb 9.6 oz (100.5 kg)   BMI 29.24 kg/m  No LMP for male patient. Psych:  Alert and  cooperative. Normal mood and affect. General:   Alert,  Well-developed, well-nourished, pleasant and cooperative in NAD Head:  Normocephalic and atraumatic. Eyes:  Sclera clear, no icterus.   Conjunctiva pink. Ears:  Normal auditory acuity. Nose:  No deformity, discharge, or lesions. Mouth:  No deformity or lesions,oropharynx pink & moist. Neck:  Supple; no masses or thyromegaly. Lungs:  Respirations even and unlabored.  Clear throughout to auscultation.   No wheezes, crackles, or rhonchi. No acute distress. Heart:  Regular rate and rhythm; no murmurs, clicks, rubs, or gallops. Abdomen:  Normal bowel sounds.  No bruits.  Soft, non-tender and non-distended without masses, hepatosplenomegaly or hernias noted.  No guarding or rebound tenderness.    Neurologic:  Alert and oriented x3;  grossly normal  neurologically. Skin:  Intact without significant lesions or rashes. No jaundice. Lymph Nodes:  No significant cervical adenopathy. Psych:  Alert and cooperative. Normal mood and affect.  Imaging Studies: US Abdomen Limited Ruq  Result Date: 04/24/2018 CLINICAL DATA:  2 month history of right upper quadrant pain. EXAM: ULTRASOUND ABDOMEN LIMITED RIGHT UPPER QUADRANT COMPARISON:  None. FINDINGS: Gallbladder: No gallstones or gallbladder wall thickening. No pericholecystic fluid. The sonographer reports no sonographic Murphy's sign. Common bile duct: Diameter: 3 mm Liver: No focal lesion identified. Within normal limits in parenchymal echogenicity. Portal vein is patent on color Doppler imaging with normal direction of blood flow towards the liver. IMPRESSION: Unremarkable right upper quadrant ultrasound. Electronically Signed   By: Kennith Center M.D.   On: 04/24/2018 08:29    Assessment and Plan:   Adrian Hughes is a 28 y.o. y/o male has been referred for abdominal pain. His history is suggestive of GERD, some element of dyspepsia. RUQ pain is not typical biliary.   Plan  1. Trial of PPI  2. H pylori breath test  3. GERD patient information and counseled on life style changes 4. IF no better at next visit likely will need HIDA scan +/- EGD    Follow up in 4 weeks  Dr [name] MD,MRCP(U.K)

## 2018-05-02 LAB — CBC WITH DIFFERENTIAL/PLATELET
BASOS ABS: 0 10*3/uL (ref 0.0–0.2)
Basos: 1 %
EOS (ABSOLUTE): 0.3 10*3/uL (ref 0.0–0.4)
Eos: 4 %
HEMOGLOBIN: 15.7 g/dL (ref 13.0–17.7)
Hematocrit: 45.4 % (ref 37.5–51.0)
IMMATURE GRANULOCYTES: 0 %
Immature Grans (Abs): 0 10*3/uL (ref 0.0–0.1)
Lymphocytes Absolute: 2.1 10*3/uL (ref 0.7–3.1)
Lymphs: 29 %
MCH: 28.9 pg (ref 26.6–33.0)
MCHC: 34.6 g/dL (ref 31.5–35.7)
MCV: 84 fL (ref 79–97)
MONOCYTES: 11 %
Monocytes Absolute: 0.8 10*3/uL (ref 0.1–0.9)
NEUTROS PCT: 55 %
Neutrophils Absolute: 4 10*3/uL (ref 1.4–7.0)
Platelets: 229 10*3/uL (ref 150–450)
RBC: 5.43 x10E6/uL (ref 4.14–5.80)
RDW: 13 % (ref 11.6–15.4)
WBC: 7.3 10*3/uL (ref 3.4–10.8)

## 2018-05-02 LAB — H. PYLORI BREATH TEST: H PYLORI BREATH TEST: NEGATIVE

## 2018-05-02 LAB — TSH: TSH: 3.55 u[IU]/mL (ref 0.450–4.500)

## 2018-05-04 ENCOUNTER — Encounter: Payer: Self-pay | Admitting: Gastroenterology

## 2018-06-01 ENCOUNTER — Ambulatory Visit: Payer: BLUE CROSS/BLUE SHIELD | Admitting: Gastroenterology

## 2018-06-01 ENCOUNTER — Encounter: Payer: Self-pay | Admitting: Gastroenterology

## 2018-06-01 VITALS — BP 123/76 | HR 116 | Ht 73.0 in | Wt 223.8 lb

## 2018-06-01 DIAGNOSIS — R1011 Right upper quadrant pain: Secondary | ICD-10-CM

## 2018-06-01 DIAGNOSIS — K219 Gastro-esophageal reflux disease without esophagitis: Secondary | ICD-10-CM

## 2018-06-01 NOTE — Progress Notes (Signed)
Wyline MoodKiran Dezman Granda MD, MRCP(U.K) [address]  [address]  [place], [place] [address]  Main: [phone]  Fax: [phone]   Primary Care Physician: Gilles Chiquitoabinowitz, Joseph H, MD  Primary Gastroenterologist:  Dr. Wyline MoodKiran Arisha Gervais   Chief Complaint  Patient presents with  . Follow-up    Abdominal pain    HPI: Adrian Hughes is a 28 y.o. male    Summary of history :  Initially referred and seen on 05/01/2018 for acid reflux , abdominal discomfort.   04/24/2018 : RUQ USG- normal.   Acid reflux  6-7 months- feeling of burning in throat , regurgitation , all day long, ongoing , worse after he eats. No recent weight gain , does wake up in the night with sensation of acid in the throat. Has taken TUMS which works occasionally. Has his dinner at 6-8 pm , goes to bed by 10 pm , in between is sitting up .Not affecting his life.   Feels like a pressure on the upper part of the abdomen- 1.5 to 2 months, RUQ pain on and off , after he eats. Occurring for the same duration , steadily getting worse. Usually begins almost immediately and at times may be after an hour, lasts a few hours, also has a pain below right shoulder blade. Np pain while exercising . Pain relieved by TUMS . Usually nothing.   Denies use of NSAID's. No daily use of alcohol use .   He has a  Bowel movement soft , not hard . No difference after a bowel movement , denies any stress. Was on Pepcid which didn't help.     Interval history   1/13//2020-  06/01/2018  05/01/2018 : H pylori breath test negative. Hb 15.7 . TSH normal    Says since last visit the issues with reflux is better. Still has RUQ pain , after he eats. He has kept track of his symptoms.   22nd pain and tenderness and bloating- after a meal within an hour- oatmeal and milk  23 rd similar symptoms had a burger Similar when he had pork chops.   Taking omeprazole 40 mg . His dad had to have his gall bladder taken out.  Current Outpatient Medications    Medication Sig Dispense Refill  . famotidine (PEPCID) 10 MG tablet Take 10 mg by mouth 2 (two) times daily.    Marland Kitchen. omeprazole (PRILOSEC) 40 MG capsule Take 1 capsule (40 mg total) by mouth daily. 90 capsule 3  . HYDROcodone-acetaminophen (NORCO/VICODIN) 5-325 MG tablet Take 1 tablet by mouth every 6 (six) hours as needed for severe pain (breakthrough pain). (Patient not taking: Reported on 05/01/2018) 10 tablet 0  . ibuprofen (ADVIL,MOTRIN) 600 MG tablet Take 1 tablet (600 mg total) by mouth every 6 (six) hours as needed. (Patient not taking: Reported on 05/01/2018) 20 tablet 0  . sucralfate (CARAFATE) 1 GM/10ML suspension Take 1 g by mouth 4 (four) times daily -  with meals and at bedtime.     No current facility-administered medications for this visit.     Allergies as of 06/01/2018  . (No Known Allergies)    ROS:  General: Negative for anorexia, weight loss, fever, chills, fatigue, weakness. ENT: Negative for hoarseness, difficulty swallowing , nasal congestion. CV: Negative for chest pain, angina, palpitations, dyspnea on exertion, peripheral edema.  Respiratory: Negative for dyspnea at rest, dyspnea on exertion, cough, sputum, wheezing.  GI: See history of present illness. GU:  Negative for dysuria, hematuria, urinary incontinence, urinary frequency,  nocturnal urination.  Endo: Negative for unusual weight change.    Physical Examination:   BP 123/76   Pulse (!) 116   Ht 6\' 1"  (1.854 m)   Wt 223 lb 12.8 oz (101.5 kg)   BMI 29.53 kg/m   General: Well-nourished, well-developed in no acute distress.  Eyes: No icterus. Conjunctivae pink. Mouth: Oropharyngeal mucosa moist and pink , no lesions erythema or exudate. Lungs: Clear to auscultation bilaterally. Non-labored. Heart: Regular rate and rhythm, no murmurs rubs or gallops.  Abdomen: Bowel sounds are normal, nontender, nondistended, no hepatosplenomegaly or masses, no abdominal bruits or hernia , no rebound or guarding.    Extremities: No lower extremity edema. No clubbing or deformities. Neuro: Alert and oriented x 3.  Grossly intact. Skin: Warm and dry, no jaundice.   Psych: Alert and cooperative, normal mood and affect.   Imaging Studies: No results found.  Assessment and Plan:   Adrian Hughes is a 28 y.o. y/o male here to follow up for abdominal pain. Reflux systems are better with a PPI and still has some post prandial pain which may be related to his gall bladder Plan  1. Continue  PPI  2. Get HIDA scan  and if negative needs  EGD   Dr [name]  MD,MRCP [facility]) Follow up in 6 weeks

## 2018-06-05 ENCOUNTER — Other Ambulatory Visit: Payer: Self-pay

## 2018-06-05 DIAGNOSIS — R1011 Right upper quadrant pain: Secondary | ICD-10-CM

## 2018-06-06 ENCOUNTER — Telehealth: Payer: Self-pay | Admitting: Gastroenterology

## 2018-06-06 NOTE — Telephone Encounter (Signed)
Spoke with pt and explained to him that he does not have to be accompanied by anyone during the HIDA scan.

## 2018-06-06 NOTE — Telephone Encounter (Signed)
PT  Is calling again he states he left a message earlier and has not heard anything about his scan to see if he needs to have someone with him for that

## 2018-06-06 NOTE — Telephone Encounter (Signed)
Pt is calling hew is having a scan done on 06/14/18 and needs to know if someone needs to accompany him and some instructions on the scan

## 2018-06-14 ENCOUNTER — Encounter
Admission: RE | Admit: 2018-06-14 | Discharge: 2018-06-14 | Disposition: A | Discharge status: Home / Self Care | Payer: BLUE CROSS/BLUE SHIELD | Source: Ambulatory Visit | Attending: Gastroenterology | Admitting: Gastroenterology

## 2018-06-14 DIAGNOSIS — R1011 Right upper quadrant pain: Secondary | ICD-10-CM | POA: Diagnosis not present

## 2018-06-14 MED ORDER — TECHNETIUM TC 99M MEBROFENIN IV KIT
5.0000 | PACK | Freq: Once | INTRAVENOUS | Status: AC | PRN
  Administered 2018-06-14: 5.318 via INTRAVENOUS

## 2018-07-12 ENCOUNTER — Ambulatory Visit: Payer: Self-pay | Admitting: Gastroenterology

## 2018-07-17 ENCOUNTER — Telehealth: Payer: Self-pay | Admitting: Gastroenterology

## 2018-07-17 ENCOUNTER — Ambulatory Visit: Payer: Self-pay | Admitting: Gastroenterology

## 2018-07-18 ENCOUNTER — Ambulatory Visit (INDEPENDENT_AMBULATORY_CARE_PROVIDER_SITE_OTHER): Payer: BLUE CROSS/BLUE SHIELD | Admitting: Gastroenterology

## 2018-07-18 ENCOUNTER — Telehealth: Payer: Self-pay | Admitting: Gastroenterology

## 2018-07-18 ENCOUNTER — Encounter: Payer: Self-pay | Admitting: Gastroenterology

## 2018-07-18 ENCOUNTER — Other Ambulatory Visit: Payer: Self-pay

## 2018-07-18 DIAGNOSIS — R1011 Right upper quadrant pain: Secondary | ICD-10-CM

## 2018-07-18 DIAGNOSIS — K219 Gastro-esophageal reflux disease without esophagitis: Secondary | ICD-10-CM

## 2018-07-18 NOTE — Progress Notes (Signed)
[name] , MD [address]  [address]  [place], [place] [address]  Main: [phone]  Fax: [phone]   Primary Care Physician: Patient, No Pcp Per  Virtual Visit via Telephone Note  I connected with patient on 07/18/18 at  9:30 AM EDT by telephone and verified that I am speaking with the correct person using two identifiers.   I discussed the limitations, risks, security and privacy concerns of performing an evaluation and management service by telephone and the availability of in person appointments. I also discussed with the patient that there may be a patient responsible charge related to this service. The patient expressed understanding and agreed to proceed.  Location of Patient: Home Location of Provider: Home Persons involved: Patient and provider only   History of Present Illness: Chief Complaint  Patient presents with   Follow-up    RUQ Abdominal pain    HPI: Adrian Hughes is a 28 y.o. male   Summary of history :  Initially referred and seen on 05/01/2018 for acid reflux , abdominal discomfort.   04/24/2018 : RUQ USG- normal.   Acid reflux 6-7 months- feeling of burning in throat , regurgitation , all day long, ongoing , worse after he eats. No recent weight gain , does wake up in the night with sensation of acid in the throat. Has taken TUMS which works occasionally. Has his dinner at 6-8 pm , goes to bed by 10 pm , in between is sitting up .Not affecting his life.   Feels like a pressure on the upper part of the abdomen- 1.5 to 2 months, RUQ pain on and off , after he eats. Occurring for the same duration , steadily getting worse. Usually begins almost immediately after he eats  and at times may be after an hour, lasts a few hours, also has a pain below right shoulder blade. Np pain while exercising . Pain relieved by TUMS . Usually nothing.   Denies use of NSAID's. No daily use of alcohol use .   He has a Bowel movement soft , not hard . No  difference after a bowel movement , denies any stress. Was on Pepcid which didn't help. His dad had to have his gall bladder taken out.  05/01/2018 : H pylori breath test negative. Hb 15.7 . TSH normal   Interval history  06/01/2018-07/18/2018  HIDA scan 06/14/2018 : Ejection fraction of gallbladder is normal at 46%, The patient did experience abdominal discomfort with the oral Ensure consumption.   Taking omeprazole 40 mg .Last 6-7 days having more reflux in the mornings.  Still has pain in the right upper quadrant on and off worse after meals.  No better after bowel movement.  Otherwise symptoms of reflux are generally under control.  Current Outpatient Medications  Medication Sig Dispense Refill   famotidine (PEPCID) 10 MG tablet Take 10 mg by mouth 2 (two) times daily.     omeprazole (PRILOSEC) 40 MG capsule Take 1 capsule (40 mg total) by mouth daily. 90 capsule 3   HYDROcodone-acetaminophen (NORCO/VICODIN) 5-325 MG tablet Take 1 tablet by mouth every 6 (six) hours as needed for severe pain (breakthrough pain). (Patient not taking: Reported on 05/01/2018) 10 tablet 0   ibuprofen (ADVIL,MOTRIN) 600 MG tablet Take 1 tablet (600 mg total) by mouth every 6 (six) hours as needed. (Patient not taking: Reported on 05/01/2018) 20 tablet 0   sucralfate (CARAFATE) 1 GM/10ML suspension Take 1 g by mouth 4 (four) times  daily -  with meals and at bedtime.     No current facility-administered medications for this visit.     Allergies as of 07/18/2018   (No Known Allergies)    Review of Systems:    All systems reviewed and negative except where noted in HPI.   Observations/Objective:  Labs: CMP     Component Value Date/Time   NA 140 01/23/2017 0431   K 3.5 01/23/2017 0431   CL 108 01/23/2017 0431   CO2 25 01/23/2017 0431   GLUCOSE 102 (H) 01/23/2017 0431   BUN 15 01/23/2017 0431   CREATININE 1.35 (H) 01/23/2017 0431   CALCIUM 8.8 (L) 01/23/2017 0431   PROT 7.0 01/19/2017 2055    ALBUMIN 4.3 01/19/2017 2055   AST 17 01/19/2017 2055   ALT 15 (L) 01/19/2017 2055   ALKPHOS 55 01/19/2017 2055   BILITOT 0.8 01/19/2017 2055   GFRNONAA >60 01/23/2017 0431   GFRAA >60 01/23/2017 0431   Lab Results  Component Value Date   WBC 7.3 05/01/2018   HGB 15.7 05/01/2018   HCT 45.4 05/01/2018   MCV 84 05/01/2018   PLT 229 05/01/2018    Imaging Studies: No results found.  Assessment and Plan:   Adrian Hughes is a 28 y.o. y/o male  here to follow up for abdominal pain. Reflux systems are better with a PPI and still has some post prandial pain which may be related to his gall bladder.  I explained to him that the pain in the right upper quadrant could still be from his gallbladder as he had replication of his symptoms during the HIDA scan.  I suggested that it probably would be best to be seen by a surgeon whom I will be referring to discuss various options.  If Dr.Pabone feels he needs an endoscopy to rule out any gastric issues prior to a cholecystectomy I would be happy to do the same.  I explained to him it is always possible that even if we do take out the gallbladder that sometimes the pain may not go away.   Plan :   1. Continue  PPI  2.  Referred to Dr. Valaria Good to discuss about  a cholecystectomy.   Follow Up Instructions:   Follow-up in my office in 6 to 8 weeks. I discussed the assessment and treatment plan with the patient. The patient was provided an opportunity to ask questions and all were answered. The patient agreed with the plan and demonstrated an understanding of the instructions.   The patient was advised to call back or seek an in-person evaluation if the symptoms worsen or if the condition fails to improve as anticipated.  I provided 25 minutes of non-face-to-face time during this encounter.  Dr [name] MD,MRCP [facility]) Gastroenterology/Hepatology Pager: [phone]   Speech recognition software was used to dictate this note.

## 2018-07-31 ENCOUNTER — Ambulatory Visit (INDEPENDENT_AMBULATORY_CARE_PROVIDER_SITE_OTHER): Payer: BLUE CROSS/BLUE SHIELD | Admitting: Surgery

## 2018-07-31 ENCOUNTER — Encounter: Payer: Self-pay | Admitting: Surgery

## 2018-07-31 ENCOUNTER — Other Ambulatory Visit: Payer: Self-pay

## 2018-07-31 VITALS — BP 130/83 | HR 102 | Temp 98.1°F | Resp 16 | Ht 73.0 in | Wt 219.6 lb

## 2018-07-31 DIAGNOSIS — R1011 Right upper quadrant pain: Secondary | ICD-10-CM | POA: Diagnosis not present

## 2018-07-31 NOTE — Patient Instructions (Signed)
Please call the office with any questions or concerns.   Please see your follow up appointment listed below.        Gallbladder Eating Plan If you have a gallbladder condition, you may have trouble digesting fats. Eating a low-fat diet can help reduce your symptoms, and may be helpful before and after having surgery to remove your gallbladder (cholecystectomy). Your health care provider may recommend that you work with a diet and nutrition specialist (dietitian) to help you reduce the amount of fat in your diet. What are tips for following this plan? General guidelines  Limit your fat intake to less than 30% of your total daily calories. If you eat around 1,800 calories each day, this is less than 60 grams (g) of fat per day.  Fat is an important part of a healthy diet. Eating a low-fat diet can make it hard to maintain a healthy body weight. Ask your dietitian how much fat, calories, and other nutrients you need each day.  Eat small, frequent meals throughout the day instead of three large meals.  Drink at least 8-10 cups of fluid a day. Drink enough fluid to keep your urine clear or pale yellow.  Limit alcohol intake to no more than 1 drink a day for nonpregnant women and 2 drinks a day for men. One drink equals 12 oz of beer, 5 oz of wine, or 1 oz of hard liquor. Reading food labels  Check Nutrition Facts on food labels for the amount of fat per serving. Choose foods with less than 3 grams of fat per serving. Shopping  Choose nonfat and low-fat healthy foods. Look for the words "nonfat," "low fat," or "fat free."  Avoid buying processed or prepackaged foods. Cooking  [place] using low-fat methods, such as baking, broiling, grilling, or boiling.  [place] with small amounts of healthy fats, such as olive oil, grapeseed oil, canola oil, or sunflower oil. What foods are recommended?   All fresh, frozen, or canned fruits and vegetables.  Whole grains.  Low-fat or non-fat (skim)  milk and yogurt.  Lean meat, skinless poultry, fish, eggs, and beans.  Low-fat protein supplement powders or drinks.  Spices and herbs. What foods are not recommended?  High-fat foods. These include baked goods, fast food, fatty cuts of meat, ice cream, french toast, sweet rolls, pizza, cheese bread, foods covered with butter, creamy sauces, or cheese.  Fried foods. These include french fries, tempura, battered fish, breaded chicken, fried breads, and sweets.  Foods with strong odors.  Foods that cause bloating and gas. Summary  A low-fat diet can be helpful if you have a gallbladder condition, or before and after gallbladder surgery.  Limit your fat intake to less than 30% of your total daily calories. This is about 60 g of fat if you eat 1,800 calories each day.  Eat small, frequent meals throughout the day instead of three large meals. This information is not intended to replace advice given to you by your health care provider. Make sure you discuss any questions you have with your health care provider. Document Released: 04/10/2013 Document Revised: 05/13/2016 Document Reviewed: 05/13/2016 Elsevier Interactive Patient Education  2019 [employer].

## 2018-07-31 NOTE — Progress Notes (Signed)
Patient ID: Adrian Hughes, male   DOB: 04/30/1990, 28 y.o.   MRN: [id number]  HPI Adrian Hughes is a 28 y.o. male seen in consultation at the request of Dr. Tobi BastosAnna for right upper quadrant pain.  He reports that he has been having this pain for several months and is located in the epigastric area as well as the right upper quadrant.  The pain is intermittent moderate in intensity and usually happens after he eats.  He reports about 75% of all the meals causes the pain but there is no specific type of food that will trigger the symptoms.  He denies any fevers any chills any biliary obstruction.  No nausea no vomiting no diarrhea and no hematochezia or melena. Does report bloating. He did have an ultrasound showing  Pers. Reviewed, no evidence of stones normal common bile duct.  Also HIDA scan with a normal ejection fraction and patent cystic duct Family history significant for gallbladder disease in both father and grandfather. No previous abd operations.  He is recently transitioned to a gluten-free diet and lactose-free diet. No previous abdominal operations.  HPI  History reviewed. No pertinent past medical history.  Past Surgical History:  Procedure Laterality Date  . INCISION AND DRAINAGE Left 01/24/2017   Procedure: INCISION AND DRAINAGE;  Surgeon: Lyndle HerrlichBowers, James R, MD;  Location: [facility] ORS;  Service: Orthopedics;  Laterality: Left;  . KNEE SURGERY    . SURGERY SCROTAL / TESTICULAR      Family History  Problem Relation Age of Onset  . Gallbladder disease Father   . Gallbladder disease Paternal Uncle     Social History Social History   Tobacco Use  . Smoking status: Never Smoker  . Smokeless tobacco: Never Used  Substance Use Topics  . Alcohol use: Yes    Comment: occ  . Drug use: No    No Known Allergies  Current Outpatient Medications  Medication Sig Dispense Refill  . ibuprofen (ADVIL,MOTRIN) 600 MG tablet Take 1 tablet (600 mg total) by mouth every 6 (six) hours as  needed. 20 tablet 0  . omeprazole (PRILOSEC) 40 MG capsule Take 1 capsule (40 mg total) by mouth daily. 90 capsule 3   No current facility-administered medications for this visit.      Review of Systems Full ROS  was asked and was negative except for the information on the HPI  Physical Exam Blood pressure 130/83, pulse (!) 102, temperature 98.1 F (36.7 C), temperature source Temporal, resp. rate 16, height 6\' 1"  (1.854 m), weight 219 lb 9.6 oz (99.6 kg), SpO2 98 %. CONSTITUTIONAL: NAD EYES: Pupils are equal, round, and reactive to light, Sclera are non-icteric. EARS, NOSE, MOUTH AND THROAT: The oropharynx is clear. The oral mucosa is pink and moist. Hearing is intact to voice. LYMPH NODES:  Lymph nodes in the neck are normal. RESPIRATORY:  Lungs are clear. There is normal respiratory effort, with equal breath sounds bilaterally, and without pathologic use of accessory muscles. CARDIOVASCULAR: Heart is regular without murmurs, gallops, or rubs. GI: The abdomen is soft, nontender, and nondistended. There are no palpable masses. There is no hepatosplenomegaly. There are normal bowel sounds in all quadrants. GU: Rectal deferred.   MUSCULOSKELETAL: Normal muscle strength and tone. No cyanosis or edema.   SKIN: Turgor is good and there are no pathologic skin lesions or ulcers. NEUROLOGIC: Motor and sensation is grossly normal. Cranial nerves are grossly intact. PSYCH:  Oriented to person, place and time. Affect is normal.  Data Reviewed  I have personally reviewed the patient's imaging, laboratory findings and medical records.    Assessment/Plan 28 year old male with right upper quadrant epigastric pain no specific at this time.  Work-up has been negative so far.  He is making some diet changes.  I will strongly recommend adhering to the changes in the diet and seeing him back again in 6 weeks if at that time he will still persist with right upper quadrant pain we may consider  cholecystectomy as part of the diagnostic and therapeutic work-up.  He understands that all his work-up has been negative so far he may require an operative scope and even a CT scan.  At this time there is no need for any urgent surgical intervention or any urgent diagnostic testing.    His questions were answered.  He agrees with my plan A copy of this report was sent to the referring provider   Sterling Big, MD FACS General Surgeon 07/31/2018, 2:47 PM

## 2018-09-04 ENCOUNTER — Ambulatory Visit: Payer: Self-pay | Admitting: Surgery

## 2018-10-18 ENCOUNTER — Ambulatory Visit: Payer: Self-pay | Admitting: Gastroenterology

## 2019-05-07 ENCOUNTER — Other Ambulatory Visit: Payer: Self-pay

## 2019-05-07 MED ORDER — OMEPRAZOLE 40 MG PO CPDR: 40.0000 mg | DELAYED_RELEASE_CAPSULE | Freq: Every day | ORAL | 3 refills | Status: DC

## 2019-06-18 ENCOUNTER — Other Ambulatory Visit: Payer: Self-pay

## 2019-06-18 ENCOUNTER — Ambulatory Visit: Payer: 59 | Admitting: Physician Assistant

## 2019-06-18 VITALS — BP 138/93 | HR 93 | Temp 99.1°F | Ht 73.0 in | Wt 215.0 lb

## 2019-06-18 DIAGNOSIS — R109 Unspecified abdominal pain: Secondary | ICD-10-CM

## 2019-06-18 NOTE — Progress Notes (Signed)
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- Is NOT symptomatic today  Had issues with RUQ pain and episodes of NV LAST YEAR We referred to GI for evaluation He reports consult resulted in recommendation for cholecystectomy He did not want to proceed and did not follow up  In the interim year has continued to have GI distress  Commonly burning discomfort mid-epigastric He and his wife decided it was gluten related and markedly adjusted diet Reports he feels much better on gluten restriction But admits he doesn't maintain the diet and so commonly feels bad  He was placed on PPI by the GI consult and felt much better taking them When Rx ran out he reports calling once for refill, but for unknown reason it did not happen and he opted not to call again.Uses OTC at times  We  briefly discussed GB, GERD, Ulcer, gastritis and in some instances even cardiac issues etc as potential causative factors in GI distress and pain . He was encouraged to schedule an office visit. He prefers that GI manage and asks referral to  be reinstated    Impression- intermittent GI distress reported  Plan- Patient would like to readdress previous GI consult and this is encouraged.Patient should be able to call their office and be scheduled and is encouraged to do so with his calendar reference.  If he is not able to personally schedule asked that he call us back and we will assist.    Reminded that last annual exam with [place] was 2019 and request he schedule update

## 2019-06-19 IMAGING — NM NM HEPATO W/GB/PHARM/[PERSON_NAME]
2 series · 12 of 12 positions shown · non-contrast
Comparison: None.

CLINICAL DATA: Upper abdominal pain

EXAM:
NUCLEAR MEDICINE HEPATOBILIARY IMAGING WITH GALLBLADDER EF
VIEWS:
Anterior right upper quadrant
RADIOPHARMACEUTICALS:  5.318 mCi 4c-88m mebrofenin IV

[Series 1000: gallbladder ef · 4.80mm/px · 6 of 120 frames shown]
[frame 11/120]
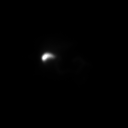
[frame 31/120]
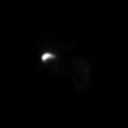
[frame 51/120]
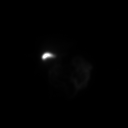
[frame 71/120]
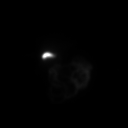
[frame 91/120]
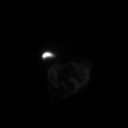
[frame 111/120]
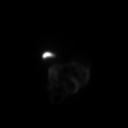

[Series 1000: hepatobiliary scan · 9.59mm/px · 6 of 60 frames shown]
[frame 6/60]
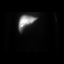
[frame 16/60]
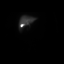
[frame 26/60]
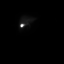
[frame 36/60]
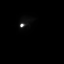
[frame 46/60]
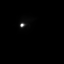
[frame 56/60]
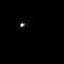

[12 of 12 positions shown; findings below may reference images not displayed]

FINDINGS: Liver uptake of radiotracer is unremarkable. There is prompt
visualization of gallbladder and small bowel, indicating patency of
the cystic and common bile ducts. Patient consumed 8 ounces of
Ensure orally with calculation of the computer generated ejection
fraction of radiotracer from the gallbladder. The patient
experienced abdominal discomfort with the oral Ensure consumption.
The computer generated ejection fraction of radiotracer from the
gallbladder is normal at 46%, normal greater than 33% using the oral
agent.
IMPRESSION: Normal ejection fraction of radiotracer from the gallbladder. The
patient did experience abdominal discomfort with the oral Ensure
consumption. Cystic and common bile ducts are patent as is evidenced
by visualization of gallbladder and small bowel.

## 2019-06-20 ENCOUNTER — Encounter: Payer: Self-pay | Admitting: Physician Assistant

## 2019-08-08 ENCOUNTER — Other Ambulatory Visit: Payer: Self-pay

## 2019-08-08 ENCOUNTER — Ambulatory Visit (INDEPENDENT_AMBULATORY_CARE_PROVIDER_SITE_OTHER): Payer: 59 | Admitting: Gastroenterology

## 2019-08-08 VITALS — BP 123/80 | HR 82 | Temp 98.0°F | Ht 73.0 in | Wt 215.0 lb

## 2019-08-08 DIAGNOSIS — K9041 Non-celiac gluten sensitivity: Secondary | ICD-10-CM | POA: Diagnosis not present

## 2019-08-08 DIAGNOSIS — Z8371 Family history of colonic polyps: Secondary | ICD-10-CM | POA: Diagnosis not present

## 2019-08-08 NOTE — Progress Notes (Signed)
Jonathon Bellows MD, MRCP(U.K) [address]  [place]  [place], [place] [address]  Main: [phone]  Fax: [phone]   Primary Care Physician: Patient, No Pcp Per  Primary Gastroenterologist:  Dr. Jonathon Bellows   Follow-up for abdominal pain  HPI: Adrian Hughes is a 29 y.o. male    Summary of history :  Initially referred and seen on 05/01/2018 for acid reflux , abdominal discomfort.  04/24/2018 : RUQ USG- normal.  05/01/2018 : H pylori breath test negative. Hb 15.7 . TSH normal  HIDA scan 06/14/2018 : Ejection fraction of gallbladder is normal at 46%, The patient did experience abdominal discomfort with the oral Ensureconsumption.    Acid reflux 6-7 months- feeling of burning in throat , regurgitation , all day long, ongoing , worse after he eats. No recent weight gain , does wake up in the night with sensation of acid in the throat. Has taken TUMS which works occasionally. Has his dinner at 6-8 pm , goes to bed by 10 pm , in between is sitting up .Not affecting his life.   Abdominal pain: Feels like a pressure on the upper part of the abdomen- 1.5 to 2 months, RUQ pain on and off , after he eats. Occurring for the same duration , steadily getting worse. Usually begins almost immediately after he eats  and at times may be after an hour, lasts a few hours, also has a pain below right shoulder blade. Np pain while exercising . Pain relieved by TUMS . Usually nothing. He has a Bowel movement soft , not hard . No difference after a bowel movement , denies any stress. Was on Pepcid which didn't help. Hvv is dad had to have his gall bladder taken out.Denies use of NSAID's. No daily use of alcohol use .      Interval history3/31/2020-08/08/2019  Seen by Dr. Dahlia Byes on 07/31/2018 for right upper quadrant pain.  Plan was that if the pain persists in 6 weeks that he may consider cholecystectomy.  Was considering a CT scan.  He states that after he was seen by the surgeons he went  off gluten completely and all the symptoms resolved.  He rechallenged by trying gluten at some point of time and had recurrence of symptoms.  The question was whether he had celiac disease.  No known celiac disease in the family.  He also mentioned that his brother had precancerous colon polyps at the age of 60. Presently off all gluten and is pain-free.  Seen on 06/18/2019 by his primary care provider.  Had some abdominal discomfort and referred back to see me.  Current Outpatient Medications  Medication Sig Dispense Refill  . ibuprofen (ADVIL,MOTRIN) 600 MG tablet Take 1 tablet (600 mg total) by mouth every 6 (six) hours as needed. 20 tablet 0  . omeprazole (PRILOSEC) 40 MG capsule Take 1 capsule (40 mg total) by mouth daily. 90 capsule 3   No current facility-administered medications for this visit.    Allergies as of 08/08/2019  . (No Known Allergies)    ROS:  General: Negative for anorexia, weight loss, fever, chills, fatigue, weakness. ENT: Negative for hoarseness, difficulty swallowing , nasal congestion. CV: Negative for chest pain, angina, palpitations, dyspnea on exertion, peripheral edema.  Respiratory: Negative for dyspnea at rest, dyspnea on exertion, cough, sputum, wheezing.  GI: See history of present illness. GU:  Negative for dysuria, hematuria, urinary incontinence, urinary frequency, nocturnal urination.  Endo: Negative for unusual weight change.  Physical Examination:   There were no vitals taken for this visit.  General: Well-nourished, well-developed in no acute distress.  Eyes: No icterus. Conjunctivae pink. Neuro: Alert and oriented x 3.  Grossly intact. Psych: Alert and cooperative, normal mood and affect.   Imaging Studies: No results found.  Assessment and Plan:   Adrian Hughes is a 29 y.o. y/o male  here to follow upfor abdominal pain and GERD. There was a concern for gallbladder dyskinesia although symptoms were not typical.  He went off all  gluten and his symptoms resolved.  He is here to discuss whether he has celiac disease.  I explained to him that the celiac serology may be falsely negative when off all gluten.  Hence I would suggest that we check him for the genes behind celiac disease along with celiac serology and a food allergy panel.  He requested to find out the cost from the insurance based on which we will order the test.  His brother also had colon polyps that were precancerous at age of 56 and  he would require a screening colonoscopy as he has never had 1.  We will schedule the same in 6 weeks.  In case he needs an upper endoscopy to confirm celiac disease it can be added on at the same time.  I will provide him a sample of the bowel prep.  If he does have polyps then he may require genetic testing to rule out any syndrome such as Lynch  I have discussed alternative options, risks & benefits,  which include, but are not limited to, bleeding, infection, perforation,respiratory complication & drug reaction.  The patient agrees with this plan & written consent will be obtained.     Dr [name]  MD,MRCP [facility]) Follow up in 2 to 3 weeks telephone visit

## 2019-08-15 LAB — CELIAC DISEASE HLA DQ ASSOC.
DQ2 (DQA1 0501/0505,DQB1 02XX): NEGATIVE
DQ8 (DQA1 03XX, DQB1 0302): NEGATIVE

## 2019-08-15 LAB — FOOD ALLERGY PROFILE
Allergen Corn, IgE: <0.1 kU/L
Clam IgE: <0.1 kU/L
Codfish IgE: <0.1 kU/L
Egg White IgE: <0.1 kU/L
Milk IgE: <0.1 kU/L
Peanut IgE: <0.1 kU/L
Scallop IgE: <0.1 kU/L
Sesame Seed IgE: <0.1 kU/L
Shrimp IgE: <0.1 kU/L
Soybean IgE: <0.1 kU/L
Walnut IgE: <0.1 kU/L
Wheat IgE: <0.1 kU/L

## 2019-08-15 LAB — CELIAC DISEASE AB SCREEN W/RFX
Antigliadin Abs, IgA: 4 units (ref 0–19)
IgA/Immunoglobulin A, Serum: 222 mg/dL (ref 90–386)
Transglutaminase IgA: <2 U/mL (ref 0–3)

## 2019-08-16 ENCOUNTER — Encounter: Payer: Self-pay | Admitting: Gastroenterology

## 2019-08-29 ENCOUNTER — Telehealth (INDEPENDENT_AMBULATORY_CARE_PROVIDER_SITE_OTHER): Payer: 59 | Admitting: Gastroenterology

## 2019-08-29 DIAGNOSIS — K9041 Non-celiac gluten sensitivity: Secondary | ICD-10-CM | POA: Diagnosis not present

## 2019-08-29 DIAGNOSIS — R1013 Epigastric pain: Secondary | ICD-10-CM | POA: Diagnosis not present

## 2019-08-29 DIAGNOSIS — G8929 Other chronic pain: Secondary | ICD-10-CM | POA: Diagnosis not present

## 2019-08-29 NOTE — Progress Notes (Signed)
Adrian Hughes , MD [address]  [place]  [place], [place] [address]  Main: [phone]  Fax: [phone]   Primary Care Physician: Patient, No Pcp Per  Virtual Visit via Video Note initially attempted video visit but later converted to a telephone visit.  I connected with patient on 08/29/19 at  9:50 AM EDT by video and verified that I am speaking with the correct person using two identifiers.   I discussed the limitations, risks, security and privacy concerns of performing an evaluation and management service by video  and the availability of in person appointments. I also discussed with the patient that there may be a patient responsible charge related to this service. The patient expressed understanding and agreed to proceed.  Location of Patient: Home Location of Provider: Home Persons involved: Patient and provider only   History of Present Illness: Chief Complaint  Patient presents with  . Follow-up    HPI: Adrian Hughes is a 29 y.o. male   Summary of history :  Initially referred and seen on 05/01/2018 for acid reflux , abdominal discomfort.  04/24/2018 : RUQ USG- normal.  05/01/2018 : H pylori breath test negative. Hb 15.7 . TSH normal  HIDA scan 06/14/2018 : Ejection fraction of gallbladder is normal at 46%, The patient did experience abdominal discomfort with the oral Ensureconsumption.Seen by Dr. Dahlia Byes on 07/31/2018 for right upper quadrant pain.  Plan was that if the pain persists in 6 weeks that he may consider cholecystectomy. After he was seen by the surgeons he went off gluten completely and all the symptoms resolved.  He rechallenged by trying gluten at some point of time and had recurrence of symptoms.  The question was whether he had celiac disease.  No known celiac disease in the family.  He also mentioned that his brother had precancerous colon polyps at the age of 31.    Acid reflux 6-7 months- feeling of burning in throat , regurgitation , all  day long, ongoing , worse after he eats. No recent weight gain , does wake up in the night with sensation of acid in the throat. Has taken TUMS which works occasionally. Has his dinner at 6-8 pm , goes to bed by 10 pm , in between is sitting up .Not affecting his life.   Abdominal pain: Feels like a pressure on the upper part of the abdomen- 1.5 to 2 months, RUQ pain on and off , after he eats. Occurring for the same duration , steadily getting worse. Usually begins almost immediatelyafter he eatsand at times may be after an hour, lasts a few hours, also has a pain below right shoulder blade. Np pain while exercising . Pain relieved by TUMS . Usually nothing. He has a Bowel movement soft , not hard . No difference after a bowel movement , denies any stress. Was on Pepcid which didn't help. Hvv is dad had to have his gall bladder taken out.Denies use of NSAID's. No daily use of alcohol use .      Interval history4/21/2021-09/06/2019  08/08/2019: Food allergy profile were all normal, celiac disease HLA testing was negative for DQ 2 and DQ 8.  Celiac serology was negative.  Occasional right upper quadrant pain.  Definitely better than what it was previously.  Has identified a few food items that make it worse.  Felt like a gluten-free beer made it worse.  Discussed results of the recent test.    Current Outpatient Medications  Medication Sig  Dispense Refill  . ibuprofen (ADVIL,MOTRIN) 600 MG tablet Take 1 tablet (600 mg total) by mouth every 6 (six) hours as needed. 20 tablet 0  . omeprazole (PRILOSEC) 40 MG capsule Take 1 capsule (40 mg total) by mouth daily. 90 capsule 3   No current facility-administered medications for this visit.    Allergies as of 08/29/2019  . (No Known Allergies)    Review of Systems:    All systems reviewed and negative except where noted in HPI.  General Appearance:    Alert, cooperative, no distress, appears stated age  Head:    Normocephalic, without  obvious abnormality, atraumatic  Eyes:    PERRL, conjunctiva/corneas clear,  Ears:    Grossly normal hearing    Neurologic:  Grossly normal    Observations/Objective:  Labs: CMP     Component Value Date/Time   NA 140 01/23/2017 0431   K 3.5 01/23/2017 0431   CL 108 01/23/2017 0431   CO2 25 01/23/2017 0431   GLUCOSE 102 (H) 01/23/2017 0431   BUN 15 01/23/2017 0431   CREATININE 1.35 (H) 01/23/2017 0431   CALCIUM 8.8 (L) 01/23/2017 0431   PROT 7.0 01/19/2017 2055   ALBUMIN 4.3 01/19/2017 2055   AST 17 01/19/2017 2055   ALT 15 (L) 01/19/2017 2055   ALKPHOS 55 01/19/2017 2055   BILITOT 0.8 01/19/2017 2055   GFRNONAA >60 01/23/2017 0431   GFRAA >60 01/23/2017 0431   Lab Results  Component Value Date   WBC 7.3 05/01/2018   HGB 15.7 05/01/2018   HCT 45.4 05/01/2018   MCV 84 05/01/2018   PLT 229 05/01/2018    Imaging Studies: No results found.  Assessment and Plan:   Adrian Hughes is a 29 y.o. y/o male here to follow upfor abdominal pain and GERD. There was a concern for gallbladder dyskinesia although symptoms were not typical.  He went off all gluten and his symptoms resolved.  No evidence of celiac disease per serology and he does not carry the genes that is required to suffer from celiac disease.  Food allergy testing also showed he had no abnormalities. His brother also had colon polyps that were precancerous at age of 12, he has been scheduled for a screening colonoscopy.  If he has polyps then we need to evaluate him for Lynch syndrome.  If the pain persists and the colonoscopy is negative will need to decide at his next visit whether we will pursue gallbladder etiology or look for other causes and pursue either an EGD and get abdominal imaging in the form of a CT scan of the abdomen.  I explained to him that we can discuss this at his next visit.     I discussed the assessment and treatment plan with the patient. The patient was provided an opportunity to ask  questions and all were answered. The patient agreed with the plan and demonstrated an understanding of the instructions.   The patient was advised to call back or seek an in-person evaluation if the symptoms worsen or if the condition fails to improve as anticipated.  I provided 15 minutes of face-to-face time during this encounter.  Dr Adrian Bellows MD,MRCP [facility]) Gastroenterology/Hepatology Pager: [phone]   Speech recognition software was used to dictate this note.

## 2019-09-10 NOTE — Progress Notes (Signed)
Scheduled to complete physical 09/19/19.  (Provider TBD)  AMD 

## 2019-09-11 ENCOUNTER — Ambulatory Visit: Payer: 59

## 2019-09-11 ENCOUNTER — Other Ambulatory Visit: Payer: Self-pay

## 2019-09-11 DIAGNOSIS — Z0000 Encounter for general adult medical examination without abnormal findings: Secondary | ICD-10-CM

## 2019-09-11 LAB — POCT URINALYSIS DIPSTICK
Bilirubin, UA: NEGATIVE
Blood, UA: NEGATIVE
Glucose, UA: NEGATIVE
Ketones, UA: NEGATIVE
Leukocytes, UA: NEGATIVE
Nitrite, UA: NEGATIVE
Protein, UA: NEGATIVE
Spec Grav, UA: >=1.03 — AB (ref 1.010–1.025)
Urobilinogen, UA: 0.2 E.U./dL
pH, UA: 5 (ref 5.0–8.0)

## 2019-09-12 LAB — CMP12+LP+TP+TSH+6AC+CBC/D/PLT
ALT: 20 IU/L (ref 0–44)
AST: 19 IU/L (ref 0–40)
Albumin/Globulin Ratio: 2.1 (ref 1.2–2.2)
Albumin: 4.5 g/dL (ref 4.0–5.0)
Alkaline Phosphatase: 61 IU/L (ref 48–121)
BUN/Creatinine Ratio: 10 (ref 9–20)
BUN: 13 mg/dL (ref 6–20)
Basophils Absolute: 0 10*3/uL (ref 0.0–0.2)
Basos: 1 %
Bilirubin Total: 0.7 mg/dL (ref 0.0–1.2)
Calcium: 9.7 mg/dL (ref 8.7–10.2)
Chloride: 108 mmol/L — ABNORMAL HIGH (ref 96–106)
Chol/HDL Ratio: 3.9 ratio (ref 0.0–5.0)
Cholesterol, Total: 166 mg/dL (ref 100–199)
Creatinine, Ser: 1.27 mg/dL (ref 0.76–1.27)
EOS (ABSOLUTE): 0.2 10*3/uL (ref 0.0–0.4)
Eos: 5 %
Estimated CHD Risk: 0.7 times avg. (ref 0.0–1.0)
Free Thyroxine Index: 2 (ref 1.2–4.9)
GFR calc Af Amer: 86 mL/min/{1.73_m2} (ref >59)
GFR calc non Af Amer: 75 mL/min/{1.73_m2} (ref >59)
GGT: 19 IU/L (ref 0–65)
Globulin, Total: 2.1 g/dL (ref 1.5–4.5)
Glucose: 95 mg/dL (ref 65–99)
HDL: 43 mg/dL (ref >39)
Hematocrit: 43.6 % (ref 37.5–51.0)
Hemoglobin: 15.2 g/dL (ref 13.0–17.7)
Immature Grans (Abs): 0 10*3/uL (ref 0.0–0.1)
Immature Granulocytes: 0 %
Iron: 102 ug/dL (ref 38–169)
LDH: 227 IU/L — ABNORMAL HIGH (ref 121–224)
LDL Chol Calc (NIH): 108 mg/dL — ABNORMAL HIGH (ref 0–99)
Lymphocytes Absolute: 1.4 10*3/uL (ref 0.7–3.1)
Lymphs: 26 %
MCH: 29.7 pg (ref 26.6–33.0)
MCHC: 34.9 g/dL (ref 31.5–35.7)
MCV: 85 fL (ref 79–97)
Monocytes Absolute: 0.5 10*3/uL (ref 0.1–0.9)
Monocytes: 10 %
Neutrophils Absolute: 3.1 10*3/uL (ref 1.4–7.0)
Neutrophils: 58 %
Phosphorus: 3.1 mg/dL (ref 2.8–4.1)
Platelets: 193 10*3/uL (ref 150–450)
Potassium: 4.3 mmol/L (ref 3.5–5.2)
RBC: 5.12 x10E6/uL (ref 4.14–5.80)
RDW: 13 % (ref 11.6–15.4)
Sodium: 144 mmol/L (ref 134–144)
T3 Uptake Ratio: 30 % (ref 24–39)
T4, Total: 6.5 ug/dL (ref 4.5–12.0)
TSH: 0.965 u[IU]/mL (ref 0.450–4.500)
Total Protein: 6.6 g/dL (ref 6.0–8.5)
Triglycerides: 80 mg/dL (ref 0–149)
Uric Acid: 7.6 mg/dL (ref 3.8–8.4)
VLDL Cholesterol Cal: 15 mg/dL (ref 5–40)
WBC: 5.3 10*3/uL (ref 3.4–10.8)

## 2019-09-19 ENCOUNTER — Ambulatory Visit: Payer: Self-pay | Admitting: Emergency Medicine

## 2019-09-19 ENCOUNTER — Other Ambulatory Visit: Payer: Self-pay

## 2019-09-19 ENCOUNTER — Other Ambulatory Visit
Admission: RE | Admit: 2019-09-19 | Discharge: 2019-09-19 | Disposition: A | Discharge status: Home / Self Care | Payer: 59 | Source: Ambulatory Visit | Attending: Gastroenterology | Admitting: Gastroenterology

## 2019-09-19 ENCOUNTER — Encounter: Payer: Self-pay | Admitting: Emergency Medicine

## 2019-09-19 VITALS — BP 122/84 | HR 91 | Temp 97.7°F | Resp 14 | Ht 73.0 in | Wt 206.8 lb

## 2019-09-19 DIAGNOSIS — Z0000 Encounter for general adult medical examination without abnormal findings: Secondary | ICD-10-CM

## 2019-09-19 DIAGNOSIS — Z20822 Contact with and (suspected) exposure to covid-19: Secondary | ICD-10-CM | POA: Diagnosis not present

## 2019-09-19 DIAGNOSIS — Z01812 Encounter for preprocedural laboratory examination: Secondary | ICD-10-CM | POA: Insufficient documentation

## 2019-09-19 LAB — SARS CORONAVIRUS 2 (TAT 6-24 HRS): SARS Coronavirus 2: NEGATIVE

## 2019-09-19 NOTE — Progress Notes (Signed)
Occupational Health Provider Note       Time seen: 1:17 PM    I have reviewed the vital signs and the nursing notes.  HISTORY   Chief Complaint Employment Physical    HPI Adrian Hughes is a 29 y.o. male with a history of gluten intolerance who presents today for annual physical examination.  Patient denies any specific complaints currently.  He has recently had some GI issues for which he is receiving an outpatient colonoscopy by Dr. Vicente Males.  Past Medical History:  Diagnosis Date  . Gluten intolerance     Past Surgical History:  Procedure Laterality Date  . INCISION AND DRAINAGE Left 01/24/2017   Procedure: INCISION AND DRAINAGE;  Surgeon: Lovell Sheehan, MD;  Location: [facility] ORS;  Service: Orthopedics;  Laterality: Left;  . KNEE SURGERY    . SURGERY SCROTAL / TESTICULAR      Allergies Patient has no known allergies.   Review of Systems Constitutional: Negative for fever. Cardiovascular: Negative for chest pain. Respiratory: Negative for shortness of breath. Gastrointestinal: Positive for occasional vomiting Musculoskeletal: Negative for back pain. Skin: Negative for rash. Neurological: Negative for headaches, focal weakness or numbness.  All systems negative/normal/unremarkable except as stated in the HPI  ____________________________________________   PHYSICAL EXAM:  VITAL SIGNS: Vitals:   09/19/19 1313  BP: 122/84  Pulse: 91  Resp: 14  Temp: 97.7 F (36.5 C)  SpO2: 98%    Constitutional: Alert and oriented. Well appearing and in no distress. Eyes: Conjunctivae are normal. Normal extraocular movements. Cardiovascular: Normal rate, regular rhythm. No murmurs, rubs, or gallops. Respiratory: Normal respiratory effort without tachypnea nor retractions. Breath sounds are clear and equal bilaterally. No wheezes/rales/rhonchi. Gastrointestinal: Soft and nontender. Normal bowel sounds Musculoskeletal: Nontender with normal range of motion in extremities.  No lower extremity tenderness nor edema. Neurologic:  Normal speech and language. No gross focal neurologic deficits are appreciated.  Skin:  Skin is warm, dry and intact. No rash noted. Psychiatric: Speech and behavior are normal.   ____________________________________________   LABS (pertinent positives/negatives)  Recent Results (from the past 2160 hour(s))  Celiac Disease Ab Screen w/Rfx     Status: None   Collection Time: 08/08/19  3:28 PM  Result Value Ref Range   Antigliadin Abs, IgA 4 0 - 19 units    Comment:                    Negative                   0 - 19                    Weak Positive             20 - 30                    Moderate to Strong Positive   >30    Transglutaminase IgA <2 0 - 3 U/mL    Comment:                               Negative        0 -  3                               Weak Positive   4 - 10  Positive           >10  Tissue Transglutaminase (tTG) has been identified  as the endomysial antigen.  Studies have demonstr-  ated that endomysial IgA antibodies have over 99%  specificity for gluten sensitive enteropathy.    IgA/Immunoglobulin A, Serum 222 90 - 386 mg/dL  Celiac Disease HLA DQ Assoc.     Status: None   Collection Time: 08/08/19  3:28 PM  Result Value Ref Range   DQ2 (DQA1 0501/0505,DQB1 02XX) Negative    DQ8 (DQA1 03XX, DQB1 0302) Negative     Comment: Final Results: DQA1*04:RY,05:CUVRZ DQB1*03:CUXKS,04:CUVSB Code Translation: CUVRZ            34:91/79:15/05:69/79:48/01:65/53:74/82:70B                  /05:20/05:24 CUVSB            04:02/04:11/04:13/04:18/04:19/04:20/04:21                  /04:22/04:23/04:24/04:25N/04:26/04:28                  /86:75/44:92/01:00/71:21/97:58/83:25                  /04:36N/04:37/04:39/04:40/04:41N/04:43                  /04:44/04:45/04:46N/04:47/04:50/04:51                  /04:52/04:53/04:54/04:55/04:56/04:57/04:58                   /04:59N/04:60/04:64/04:65/04:66/04:67                  /04:68N/04:70/04:72/04:76/04:77 QDIYM            03:01/03:21/03:22/03:27/03:28/03:29/03:35                  /03:42/03:44/03:46/03:47/03:49/03:50/03:51                  /03:55/03:73/03:83/03:84N/03:92/03:93                  /41:58/30:940/76:808/81:103/15:945/85:929                  /24:462/86:381/77:116/57:903Y/33:383                  /03:206/03:241/03:242/03:243/03:246/03:252                  /29:191 /03:254/03:266/03:276N/03:281                  /66:060/04:599/77:414/23:953/20:233/43:568                  /61:683/72:902/11:155/20:802/23:361/22:449                  /03:309/03:312/03:314/03:317/03:326/03:328                  /03:329/03:330/03:331/03:338N/03:340N                  /75:300/51:102/11:173/56:701I/10:301T                  /03:361/03:370/03:377/03:378/03:391/03:396                  /03:399N/03:400N/03:404/03:419/03:420 RY               04/23/05/08 The patient is positive for DQA1*05, one half of the DQ2 heterodimer.  The Celiac Disease risk from the HLA DQA/DQB genotype is approximately 1:1842 (0.05%).  This is less than the 1% risk in the general population. Allele interpretation for all loci based on IMGT/HLA database version 3.39.0 HLA Lab CLIA ID Number [id number] Greater than 95% of  celiac patients are positive for either DQ2 or DQ8 (Sollid and Montgomery, (1993)  Gastroenterology [phone]). However these antigens may also be present in patients who d o not have Celiac disease.    Comment: Comment     Comment: This test was performed using Polymerase Chain Reaction/(PCR)Sequence Specific Oligonucleotide Probes (SSOP) (Luminex) technique. Sequence Based Typing (SBT) and/or Sequence Specific Primers (SSP) may be used as supplemental methods when necessary. Please contact HLA Customer Service at [phone] if you have any questions. Director of HLA Laboratory Dr Brooks Sailors, PhD    Additional Information:  Comment     Comment:    [phone]. 2. Megiorni F, Max Fickle, Bonamico M et al. HLA-DQ and risk gradient    for celiac disease. Hum Immunol 2009; 70:55-59. 3. Pietzak MM, Schofield TC, McGinnis FM et al. Stratifying risk    for celiac disease in a large at-risk [place] population    by using HLA alleles. Clin Gastroenterol Hepatol 2009; [phone]. 4. Sollid LM and Lie BA. (2005). Celiac Disease Genetics: Current    Concepts and Information systems manager. Clin Gastroenterol and    Hepat 9:672-897. 5. Domenica Fail, Young DO, Green PHR, et al. Celiac Disease.    In: Pagon RA, Nicky Pugh Caleen Essex, editors. GeneReviews    (Internet), [place] [employer] [place], [place], October 20, 2006:1-27.    GraySmoke.es.fcgi?book=genepart=celiac    PMID [id number] (PubMed) 6. Treem W. Emerging concepts in celiac disease. Curr Opin Pediatr    2004;16:552-559.   Food Allergy Profile     Status: None   Collection Time: 08/08/19  3:28 PM  Result Value Ref Range   Class Description Allergens Comment     Comment:     Levels of Specific IgE       Class  Description of Class     ---------------------------  -----  --------------------                    < 0.10         0         Negative            0.10 -    0.31         0/I       Equivocal/Low            0.32 -    0.55         I         Low            0.56 -    1.40         II        Moderate            1.41 -    3.90         III       High            3.91 -   19.00         IV        Very High           19.01 -  100.00         V         Very High                   >100.00         VI        Very High  Egg White IgE <0.10 Class 0 kU/L   Peanut IgE <0.10 Class 0 kU/L   Soybean IgE <0.10 Class 0 kU/L   Milk IgE <0.10 Class 0 kU/L   Clam IgE <0.10 Class 0 kU/L   Shrimp IgE <0.10 Class 0 kU/L   Walnut IgE <0.10 Class 0 kU/L   Codfish IgE <0.10 Class 0 kU/L   Scallop IgE <0.10 Class 0 kU/L   Wheat IgE <0.10 Class 0 kU/L    Allergen Corn, IgE <0.10 Class 0 kU/L   Sesame Seed IgE <0.10 Class 0 kU/L  CMP12+LP+TP+TSH+6AC+CBC/D/Plt     Status: Abnormal   Collection Time: 09/11/19  8:17 AM  Result Value Ref Range   Glucose 95 65 - 99 mg/dL   Uric Acid 7.6 3.8 - 8.4 mg/dL    Comment:            Therapeutic target for gout patients: <6.0   BUN 13 6 - 20 mg/dL   Creatinine, Ser 1.27 0.76 - 1.27 mg/dL   GFR calc non Af Amer 75 >59 mL/min/1.73   GFR calc Af Amer 86 >59 mL/min/1.73    Comment: **[facility] currently reports eGFR in compliance with the current**   recommendations of the [employer]. [facility] will   update reporting as new guidelines are published from the NKF-ASN   Task force.    BUN/Creatinine Ratio 10 9 - 20   Sodium 144 134 - 144 mmol/L   Potassium 4.3 3.5 - 5.2 mmol/L   Chloride 108 (H) 96 - 106 mmol/L   Calcium 9.7 8.7 - 10.2 mg/dL   Phosphorus 3.1 2.8 - 4.1 mg/dL   Total Protein 6.6 6.0 - 8.5 g/dL   Albumin 4.5 4.0 - 5.0 g/dL   Globulin, Total 2.1 1.5 - 4.5 g/dL   Albumin/Globulin Ratio 2.1 1.2 - 2.2   Bilirubin Total 0.7 0.0 - 1.2 mg/dL   Alkaline Phosphatase 61 48 - 121 IU/L    Comment:               **Please note reference interval change**   LDH 227 (H) 121 - 224 IU/L   AST 19 0 - 40 IU/L   ALT 20 0 - 44 IU/L   GGT 19 0 - 65 IU/L   Iron 102 38 - 169 ug/dL   Cholesterol, Total 166 100 - 199 mg/dL   Triglycerides 80 0 - 149 mg/dL   HDL 43 >39 mg/dL   VLDL Cholesterol Cal 15 5 - 40 mg/dL   LDL Chol Calc (NIH) 108 (H) 0 - 99 mg/dL   Chol/HDL Ratio 3.9 0.0 - 5.0 ratio    Comment:                                   T. Chol/HDL Ratio                                             Men  Women                               1/2 Avg.Risk  3.4    3.3  Avg.Risk  5.0    4.4                                2X Avg.Risk  9.6    7.1                                3X Avg.Risk 23.4   11.0    Estimated CHD Risk 0.7 0.0 - 1.0 times avg.    Comment: The CHD  Risk is based on the T. Chol/HDL ratio. Other factors affect CHD Risk such as hypertension, smoking, diabetes, severe obesity, and family history of premature CHD.    TSH 0.965 0.450 - 4.500 uIU/mL   T4, Total 6.5 4.5 - 12.0 ug/dL   T3 Uptake Ratio 30 24 - 39 %   Free Thyroxine Index 2.0 1.2 - 4.9   WBC 5.3 3.4 - 10.8 x10E3/uL   RBC 5.12 4.14 - 5.80 x10E6/uL   Hemoglobin 15.2 13.0 - 17.7 g/dL   Hematocrit 43.6 37.5 - 51.0 %   MCV 85 79 - 97 fL   MCH 29.7 26.6 - 33.0 pg   MCHC 34.9 31.5 - 35.7 g/dL   RDW 13.0 11.6 - 15.4 %   Platelets 193 150 - 450 x10E3/uL   Neutrophils 58 Not Estab. %   Lymphs 26 Not Estab. %   Monocytes 10 Not Estab. %   Eos 5 Not Estab. %   Basos 1 Not Estab. %   Neutrophils Absolute 3.1 1.4 - 7.0 x10E3/uL   Lymphocytes Absolute 1.4 0.7 - 3.1 x10E3/uL   Monocytes Absolute 0.5 0.1 - 0.9 x10E3/uL   EOS (ABSOLUTE) 0.2 0.0 - 0.4 x10E3/uL   Basophils Absolute 0.0 0.0 - 0.2 x10E3/uL   Immature Granulocytes 0 Not Estab. %   Immature Grans (Abs) 0.0 0.0 - 0.1 x10E3/uL  POCT urinalysis dipstick     Status: Abnormal   Collection Time: 09/11/19  9:04 AM  Result Value Ref Range   Color, UA dark yellow    Clarity, UA cloudy    Glucose, UA Negative Negative   Bilirubin, UA negative    Ketones, UA negative    Spec Grav, UA >=1.030 (A) 1.010 - 1.025   Blood, UA negative    pH, UA 5.0 5.0 - 8.0   Protein, UA Negative Negative   Urobilinogen, UA 0.2 0.2 or 1.0 E.U./dL   Nitrite, UA negatve    Leukocytes, UA Negative Negative   Appearance     Odor     DIFFERENTIAL DIAGNOSIS  Annual physical examination  ASSESSMENT AND PLAN  Annual physical examination   Plan: The patient had presented for an annual exam. Patient's labs are reassuring.  He has outpatient follow-up scheduled with GI.  He is cleared for follow-up as needed.  Lenise Arena MD    Note: This note was generated in part or whole with voice recognition software. Voice recognition is usually  quite accurate but there are transcription errors that can and very often do occur. I apologize for any typographical errors that were not detected and corrected.

## 2019-09-21 ENCOUNTER — Ambulatory Visit
Admission: RE | Admit: 2019-09-21 | Discharge: 2019-09-21 | Disposition: A | Discharge status: Home / Self Care | Payer: 59 | Attending: Gastroenterology | Admitting: Gastroenterology

## 2019-09-21 ENCOUNTER — Encounter
Admission: RE | Disposition: A | Discharge status: Home / Self Care | Payer: Self-pay | Source: Home / Self Care | Attending: Gastroenterology

## 2019-09-21 ENCOUNTER — Ambulatory Visit: Payer: 59 | Admitting: Registered Nurse

## 2019-09-21 ENCOUNTER — Encounter: Payer: Self-pay | Admitting: Gastroenterology

## 2019-09-21 ENCOUNTER — Other Ambulatory Visit: Payer: Self-pay

## 2019-09-21 DIAGNOSIS — Z8379 Family history of other diseases of the digestive system: Secondary | ICD-10-CM | POA: Insufficient documentation

## 2019-09-21 DIAGNOSIS — Z1211 Encounter for screening for malignant neoplasm of colon: Secondary | ICD-10-CM | POA: Insufficient documentation

## 2019-09-21 DIAGNOSIS — Z79899 Other long term (current) drug therapy: Secondary | ICD-10-CM | POA: Diagnosis not present

## 2019-09-21 DIAGNOSIS — Z8371 Family history of colonic polyps: Secondary | ICD-10-CM | POA: Insufficient documentation

## 2019-09-21 DIAGNOSIS — K9041 Non-celiac gluten sensitivity: Secondary | ICD-10-CM

## 2019-09-21 HISTORY — PX: COLONOSCOPY WITH PROPOFOL: SHX5780

## 2019-09-21 SURGERY — COLONOSCOPY WITH PROPOFOL
Anesthesia: General

## 2019-09-21 MED ORDER — DEXMEDETOMIDINE HCL 200 MCG/2ML IV SOLN
INTRAVENOUS | Status: DC | PRN
  Administered 2019-09-21: 20 ug via INTRAVENOUS

## 2019-09-21 MED ORDER — PROPOFOL 10 MG/ML IV BOLUS
INTRAVENOUS | Status: DC | PRN
  Administered 2019-09-21: 100 mg via INTRAVENOUS
  Administered 2019-09-21: 30 mg via INTRAVENOUS

## 2019-09-21 MED ORDER — LIDOCAINE HCL (CARDIAC) PF 100 MG/5ML IV SOSY
PREFILLED_SYRINGE | INTRAVENOUS | Status: DC | PRN
  Administered 2019-09-21: 20 mg via INTRAVENOUS
  Administered 2019-09-21: 80 mg via INTRAVENOUS

## 2019-09-21 MED ORDER — SODIUM CHLORIDE 0.9 % IV SOLN
INTRAVENOUS | Status: DC
  Administered 2019-09-21: 1000 mL via INTRAVENOUS

## 2019-09-21 MED ORDER — PROPOFOL 500 MG/50ML IV EMUL
INTRAVENOUS | Status: DC | PRN
  Administered 2019-09-21: 200 ug/kg/min via INTRAVENOUS

## 2019-09-21 NOTE — Anesthesia Preprocedure Evaluation (Signed)
Anesthesia Evaluation  Patient identified by MRN, date of birth, ID band Patient awake    Reviewed: Allergy & Precautions, H&P , NPO status , Patient's Chart, lab work & pertinent test results  History of Anesthesia Complications Negative for: history of anesthetic complications  Airway Mallampati: III  TM Distance: >3 FB Neck ROM: full    Dental  (+) Chipped   Pulmonary neg pulmonary ROS, neg shortness of breath,    Pulmonary exam normal        Cardiovascular Exercise Tolerance: Good (-) angina(-) DOE negative cardio ROS Normal cardiovascular exam     Neuro/Psych negative neurological ROS  negative psych ROS   GI/Hepatic negative GI ROS, Neg liver ROS, neg GERD  ,  Endo/Other  negative endocrine ROS  Renal/GU negative Renal ROS  negative genitourinary   Musculoskeletal  (+) Arthritis ,   Abdominal   Peds  Hematology negative hematology ROS (+)   Anesthesia Other Findings Past Medical History: No date: Gluten intolerance  Past Surgical History: 01/24/2017: INCISION AND DRAINAGE; Left     Comment:  Procedure: INCISION AND DRAINAGE;  Surgeon: Lyndle Herrlich, MD;  Location: [facility] ORS;  Service: Orthopedics;               Laterality: Left; No date: KNEE SURGERY No date: SURGERY SCROTAL / TESTICULAR  BMI    Body Mass Index: 28.00 kg/m      Reproductive/Obstetrics negative OB ROS                             Anesthesia Physical Anesthesia Plan  ASA: II  Anesthesia Plan: General   Post-op Pain Management:    Induction: Intravenous  PONV Risk Score and Plan: Propofol infusion and TIVA  Airway Management Planned: Natural Airway and Nasal Cannula  Additional Equipment:   Intra-op Plan:   Post-operative Plan:   Informed Consent: I have reviewed the patients History and Physical, chart, labs and discussed the procedure including the risks, benefits and  alternatives for the proposed anesthesia with the patient or authorized representative who has indicated his/her understanding and acceptance.     Dental Advisory Given  Plan Discussed with: Anesthesiologist, CRNA and Surgeon  Anesthesia Plan Comments: (Patient consented for risks of anesthesia including but not limited to:  - adverse reactions to medications - risk of intubation if required - damage to eyes, teeth, lips or other oral mucosa - nerve damage due to positioning  - sore throat or hoarseness - Damage to heart, brain, nerves, lungs, other parts of body or loss of life  Patient voiced understanding.)        Anesthesia Quick Evaluation

## 2019-09-21 NOTE — H&P (Signed)
Adrian Mood, MD [address], [place], [place], [address] [address], [place], [place], [address] Phone: [phone]  Fax: [phone]  Primary Care Physician:  Patient, No Pcp Per   Pre-Procedure History & Physical: HPI:  Adrian Hughes is a 29 y.o. male is here for an colonoscopy.   Past Medical History:  Diagnosis Date  . Gluten intolerance     Past Surgical History:  Procedure Laterality Date  . INCISION AND DRAINAGE Left 01/24/2017   Procedure: INCISION AND DRAINAGE;  Surgeon: Lyndle Herrlich, MD;  Location: [facility] ORS;  Service: Orthopedics;  Laterality: Left;  . KNEE SURGERY    . SURGERY SCROTAL / TESTICULAR      Prior to Admission medications   Medication Sig Start Date End Date Taking? Authorizing Provider  ibuprofen (ADVIL,MOTRIN) 600 MG tablet Take 1 tablet (600 mg total) by mouth every 6 (six) hours as needed. 01/18/17   Nita Sickle, MD  omeprazole (PRILOSEC) 40 MG capsule Take 1 capsule (40 mg total) by mouth daily. 05/07/19   Adrian Mood, MD    Allergies as of 08/08/2019  . (No Known Allergies)    Family History  Problem Relation Age of Onset  . Gallbladder disease Father   . Gallbladder disease Paternal Uncle     Social History   Socioeconomic History  . Marital status: Married    Spouse name: Not on file  . Number of children: Not on file  . Years of education: Not on file  . Highest education level: Not on file  Occupational History  . Not on file  Tobacco Use  . Smoking status: Never Smoker  . Smokeless tobacco: Never Used  Substance and Sexual Activity  . Alcohol use: Yes    Comment: occ  . Drug use: No  . Sexual activity: Yes  Other Topics Concern  . Not on file  Social History Narrative  . Not on file   Social Determinants of Health   Financial Resource Strain:   . Difficulty of Paying Living Expenses:   Food Insecurity:   . Worried About Programme researcher, broadcasting/film/video in the Last Year:   . Engineer, site in the Last Year:   Transportation Needs:   . Freight forwarder (Medical):   Marland Kitchen Lack of Transportation (Non-Medical):   Physical Activity:   . Days of Exercise per Week:   . Minutes of Exercise per Session:   Stress:   . Feeling of Stress :   Social Connections:   . Frequency of Communication with Friends and Family:   . Frequency of Social Gatherings with Friends and Family:   . Attends Religious Services:   . Active Member of Clubs or Organizations:   . Attends Banker Meetings:   Marland Kitchen Marital Status:   Intimate Partner Violence:   . Fear of Current or Ex-Partner:   . Emotionally Abused:   Marland Kitchen Physically Abused:   . Sexually Abused:     Review of Systems: See HPI, otherwise negative ROS  Physical Exam: BP 127/86   Temp (!) 96.8 F (36 C) (Temporal)   Resp 20   Ht 6' (1.829 m)   Wt 93.6 kg   SpO2 100%   BMI 28.00 kg/m  General:   Alert,  pleasant and cooperative in NAD Head:  Normocephalic and atraumatic. Neck:  Supple; no masses or thyromegaly. Lungs:  Clear throughout to auscultation, normal respiratory effort.    Heart:  +  S1, +S2, Regular rate and rhythm, No edema. Abdomen:  Soft, nontender and nondistended. Normal bowel sounds, without guarding, and without rebound.   Neurologic:  Alert and  oriented x4;  grossly normal neurologically.  Impression/Plan: Adrian Hughes is here for an colonoscopy to be performed for Screening colonoscopy brother had colon polyps at age21 Risks, benefits, limitations, and alternatives regarding  colonoscopy have been reviewed with the patient.  Questions have been answered.  All parties agreeable.   Jonathon Bellows, MD  09/21/2019, 9:37 AM

## 2019-09-21 NOTE — Transfer of Care (Signed)
Immediate Anesthesia Transfer of Care Note  Patient: Adrian Hughes  Procedure(s) Performed: COLONOSCOPY WITH PROPOFOL (N/A )  Patient Location: Endoscopy Unit  Anesthesia Type:General  Level of Consciousness: drowsy  Airway & Oxygen Therapy: Patient Spontanous Breathing  Post-op Assessment: Report given to RN and Post -op Vital signs reviewed and stable  Post vital signs: Reviewed and stable  Last Vitals:  Vitals Value Taken Time  BP 94/62 09/21/19 1003  Temp    Pulse 84 09/21/19 1004  Resp 5 09/21/19 1004  SpO2 96 % 09/21/19 1004  Vitals shown include unvalidated device data.  Last Pain:  Vitals:   09/21/19 0841  TempSrc: Temporal  PainSc: 0-No pain         Complications: No apparent anesthesia complications

## 2019-09-21 NOTE — Anesthesia Postprocedure Evaluation (Signed)
Anesthesia Post Note  Patient: Adrian Hughes  Procedure(s) Performed: COLONOSCOPY WITH PROPOFOL (N/A )  Patient location during evaluation: Endoscopy Anesthesia Type: General Level of consciousness: awake and alert Pain management: pain level controlled Vital Signs Assessment: post-procedure vital signs reviewed and stable Respiratory status: spontaneous breathing, nonlabored ventilation, respiratory function stable and patient connected to nasal cannula oxygen Cardiovascular status: blood pressure returned to baseline and stable Postop Assessment: no apparent nausea or vomiting Anesthetic complications: no     Last Vitals:  Vitals:   09/21/19 0841 09/21/19 1004  BP: 127/86   Resp: 20 12  Temp: (!) 36 C (!) 35.9 C  SpO2: 100%     Last Pain:  Vitals:   09/21/19 1034  TempSrc:   PainSc: 0-No pain                 Cleda Mccreedy Renardo Cheatum

## 2019-09-21 NOTE — Op Note (Signed)
[facility] Gastroenterology Patient Name: Adrian Hughes Procedure Date: 09/21/2019 9:37 AM MRN: [id number] Account #: [id number] Date of Birth: October 10, 1990 Admit Type: Outpatient Age: 29 Room: [facility] ENDO ROOM 4 Gender: Male Note Status: Finalized Procedure:             Colonoscopy Indications:           Colon cancer screening in patient at increased risk:                         Family history of 1st-degree relative with colon                         polyps at age 72 years (or older) Providers:             [name] MD, MD Medicines:             Monitored Anesthesia Care Complications:         No immediate complications. Procedure:             Pre-Anesthesia Assessment:                        - Prior to the procedure, a History and Physical was                         performed, and patient medications, allergies and                         sensitivities were reviewed. The patient's tolerance                         of previous anesthesia was reviewed.                        - The risks and benefits of the procedure and the                         sedation options and risks were discussed with the                         patient. All questions were answered and informed                         consent was obtained.                        - ASA Grade Assessment: II - A patient with mild                         systemic disease.                        After obtaining informed consent, the colonoscope was                         passed under direct vision. Throughout the procedure,                         the patient's blood pressure, pulse, and oxygen  saturations were monitored continuously. The                         Colonoscope was introduced through the anus and                         advanced to the the cecum, identified by the                         appendiceal orifice. The colonoscopy was performed                         with ease.  The patient tolerated the procedure well.                         The quality of the bowel preparation was excellent. Findings:      The perianal and digital rectal examinations were normal.      The entire examined colon appeared normal on direct and retroflexion       views. Impression:            - The entire examined colon is normal on direct and                         retroflexion views.                        - No specimens collected. Recommendation:        - Discharge patient to home (with escort).                        - Resume previous diet.                        - Continue present medications.                        - Repeat colonoscopy in 5 years for screening purposes. Procedure Code(s):     --- Professional ---                        [id number], Colonoscopy, flexible; diagnostic, including                         collection of specimen(s) by brushing or washing, when                         performed (separate procedure) Diagnosis Code(s):     --- Professional ---                        Z83.71, Family history of colonic polyps CPT copyright 2019 American Medical Association. All rights reserved. The codes documented in this report are preliminary and upon coder review may  be revised to meet current compliance requirements. Jonathon Bellows, MD Jonathon Bellows MD, MD 09/21/2019 10:00:35 AM This report has been signed electronically. Number of Addenda: 0 Note Initiated On: 09/21/2019 9:37 AM Scope Withdrawal Time: 0 hours 9 minutes 11 seconds  Total Procedure Duration: 0 hours 9 minutes 54 seconds  Estimated Blood Loss:  Estimated blood loss: none.      [place]  [facility]

## 2019-09-24 ENCOUNTER — Encounter: Payer: Self-pay | Admitting: *Deleted

## 2019-09-27 DIAGNOSIS — Z0100 Encounter for examination of eyes and vision without abnormal findings: Secondary | ICD-10-CM | POA: Diagnosis not present

## 2019-09-27 DIAGNOSIS — Z135 Encounter for screening for eye and ear disorders: Secondary | ICD-10-CM | POA: Diagnosis not present

## 2019-10-08 DIAGNOSIS — H18621 Keratoconus, unstable, right eye: Secondary | ICD-10-CM | POA: Diagnosis not present

## 2020-06-12 ENCOUNTER — Other Ambulatory Visit: Payer: Self-pay

## 2020-06-12 DIAGNOSIS — Z1152 Encounter for screening for COVID-19: Secondary | ICD-10-CM

## 2020-06-12 LAB — POCT RAPID STREP A (OFFICE): Rapid Strep A Screen: NEGATIVE

## 2020-06-12 NOTE — Addendum Note (Signed)
Addended by: Christianne Dolin F on: 06/12/2020 11:05 AM   Modules accepted: Orders

## 2020-06-13 LAB — NOVEL CORONAVIRUS, NAA: SARS-CoV-2, NAA: DETECTED — AB

## 2020-06-13 LAB — SARS-COV-2, NAA 2 DAY TAT

## 2020-07-14 ENCOUNTER — Other Ambulatory Visit: Payer: Self-pay

## 2020-07-14 ENCOUNTER — Ambulatory Visit: Payer: Self-pay

## 2020-07-14 DIAGNOSIS — Z0000 Encounter for general adult medical examination without abnormal findings: Secondary | ICD-10-CM

## 2020-07-14 LAB — POCT URINALYSIS DIPSTICK
Bilirubin, UA: NEGATIVE
Blood, UA: NEGATIVE
Glucose, UA: NEGATIVE
Ketones, UA: NEGATIVE
Leukocytes, UA: NEGATIVE
Nitrite, UA: NEGATIVE
Protein, UA: POSITIVE — AB
Spec Grav, UA: 1.02 (ref 1.010–1.025)
Urobilinogen, UA: 0.2 E.U./dL
pH, UA: 6 (ref 5.0–8.0)

## 2020-07-14 NOTE — Progress Notes (Signed)
Pt scheduled to complete physical 08/20/20 with Viviano Simas, FNP. CL,RMA

## 2020-07-14 NOTE — Progress Notes (Signed)
Scheduled to complete physical 07/22/20 with Viviano Simas, FNP.  AMD

## 2020-07-15 LAB — CMP12+LP+TP+TSH+6AC+CBC/D/PLT
ALT: 19 IU/L (ref 0–44)
AST: 17 IU/L (ref 0–40)
Albumin/Globulin Ratio: 2 (ref 1.2–2.2)
Albumin: 4.6 g/dL (ref 4.0–5.0)
Alkaline Phosphatase: 70 IU/L (ref 44–121)
BUN/Creatinine Ratio: 10 (ref 9–20)
BUN: 13 mg/dL (ref 6–20)
Basophils Absolute: 0 10*3/uL (ref 0.0–0.2)
Basos: 1 %
Bilirubin Total: 0.7 mg/dL (ref 0.0–1.2)
Calcium: 9.8 mg/dL (ref 8.7–10.2)
Chloride: 105 mmol/L (ref 96–106)
Chol/HDL Ratio: 4.4 ratio (ref 0.0–5.0)
Cholesterol, Total: 168 mg/dL (ref 100–199)
Creatinine, Ser: 1.31 mg/dL — ABNORMAL HIGH (ref 0.76–1.27)
EOS (ABSOLUTE): 0.2 10*3/uL (ref 0.0–0.4)
Eos: 3 %
Estimated CHD Risk: 0.9 times avg. (ref 0.0–1.0)
Free Thyroxine Index: 2.3 (ref 1.2–4.9)
GGT: 21 IU/L (ref 0–65)
Globulin, Total: 2.3 g/dL (ref 1.5–4.5)
Glucose: 89 mg/dL (ref 65–99)
HDL: 38 mg/dL — ABNORMAL LOW (ref >39)
Hematocrit: 45.6 % (ref 37.5–51.0)
Hemoglobin: 15.9 g/dL (ref 13.0–17.7)
Immature Grans (Abs): 0 10*3/uL (ref 0.0–0.1)
Immature Granulocytes: 0 %
Iron: 107 ug/dL (ref 38–169)
LDH: 215 IU/L (ref 121–224)
LDL Chol Calc (NIH): 105 mg/dL — ABNORMAL HIGH (ref 0–99)
Lymphocytes Absolute: 1.6 10*3/uL (ref 0.7–3.1)
Lymphs: 26 %
MCH: 29 pg (ref 26.6–33.0)
MCHC: 34.9 g/dL (ref 31.5–35.7)
MCV: 83 fL (ref 79–97)
Monocytes Absolute: 0.6 10*3/uL (ref 0.1–0.9)
Monocytes: 10 %
Neutrophils Absolute: 3.7 10*3/uL (ref 1.4–7.0)
Neutrophils: 60 %
Phosphorus: 3.3 mg/dL (ref 2.8–4.1)
Platelets: 219 10*3/uL (ref 150–450)
Potassium: 4.2 mmol/L (ref 3.5–5.2)
RBC: 5.49 x10E6/uL (ref 4.14–5.80)
RDW: 12.9 % (ref 11.6–15.4)
Sodium: 141 mmol/L (ref 134–144)
T3 Uptake Ratio: 29 % (ref 24–39)
T4, Total: 8 ug/dL (ref 4.5–12.0)
TSH: 0.966 u[IU]/mL (ref 0.450–4.500)
Total Protein: 6.9 g/dL (ref 6.0–8.5)
Triglycerides: 138 mg/dL (ref 0–149)
Uric Acid: 6.9 mg/dL (ref 3.8–8.4)
VLDL Cholesterol Cal: 25 mg/dL (ref 5–40)
WBC: 6.1 10*3/uL (ref 3.4–10.8)
eGFR: 74 mL/min/{1.73_m2} (ref >59)

## 2020-07-24 ENCOUNTER — Encounter: Payer: Self-pay | Admitting: Physician Assistant

## 2020-07-24 ENCOUNTER — Other Ambulatory Visit: Payer: Self-pay

## 2020-07-24 ENCOUNTER — Ambulatory Visit: Payer: Self-pay | Admitting: Physician Assistant

## 2020-07-24 VITALS — BP 138/95 | HR 111 | Temp 97.8°F | Resp 14 | Ht 72.0 in | Wt 215.0 lb

## 2020-07-24 DIAGNOSIS — Z0000 Encounter for general adult medical examination without abnormal findings: Secondary | ICD-10-CM

## 2020-07-24 NOTE — Progress Notes (Signed)
   Subjective: Annual physical exam    Patient ID: Adrian Hughes, male    DOB: [birth year], 30 y.o.   MRN: [id number]  HPI Patient presents annual physical exam voices no concerns or complaints.   Review of Systems    Negative Objective:   Physical Exam No acute distress.  BP is 138/95, pulse 95, respiration 14, temperature 97.8, patient is 95% O2 sat on room air.  HEENT is unremarkable.  Neck is supple without adenopathy or bruits.  Lungs are clear to auscultation.  Heart regular rate and rhythm.  Abdomen is negative HSM, normoactive bowel sounds, soft, nontender to palpation.  No obvious deformity to the upper or lower extremities.  Patient had full and equal range of motion of the upper and lower extremities.  No obvious cervical or lumbar spine deformity.  Patient had full and equal range of motion of the cervical lumbar spine.  Cranial nerves II through XII grossly intact.       Assessment & Plan: Well exam.  Discussed lab results with patient.  Advised to follow-up as necessary.

## 2020-07-24 NOTE — Progress Notes (Signed)
Keratoconus

## 2020-10-10 DIAGNOSIS — H18621 Keratoconus, unstable, right eye: Secondary | ICD-10-CM | POA: Diagnosis not present

## 2020-10-17 DIAGNOSIS — H18621 Keratoconus, unstable, right eye: Secondary | ICD-10-CM | POA: Diagnosis not present

## 2020-11-05 ENCOUNTER — Other Ambulatory Visit: Payer: Self-pay

## 2020-11-05 ENCOUNTER — Ambulatory Visit: Payer: Self-pay | Admitting: Physician Assistant

## 2020-11-05 ENCOUNTER — Encounter: Payer: Self-pay | Admitting: Physician Assistant

## 2020-11-05 VITALS — BP 136/97 | HR 81 | Temp 98.0°F | Resp 14 | Ht 73.0 in | Wt 215.0 lb

## 2020-11-05 DIAGNOSIS — K1370 Unspecified lesions of oral mucosa: Secondary | ICD-10-CM

## 2020-11-05 NOTE — Progress Notes (Signed)
   Subjective:Tongue disorder    Patient ID: Adrian Hughes, male    DOB: 06/18/1990, 30 y.o.   MRN: [id number]  HPI Patient presents with orange discoloration of the tongue.  Onset 2 weeks ago.  Denies any provocative incident for complaint.  Patient color is fading.  Denies loss of taste or smell.  Denies pain.  Denies new foods or drinks. Review of Systems Negative septal complaint    Objective:   Physical Exam No acute distress.  Vital signs stable.  There is a slight discoloration to the anterior tongue.       Assessment & Plan:   Patient will have labs drawn today CBC and B12/folate levels.  Follow-up in 6 days.  If no acute findings on lab will consult to ENT.

## 2020-11-06 LAB — CBC WITH DIFFERENTIAL/PLATELET
Basophils Absolute: 0.1 10*3/uL (ref 0.0–0.2)
Basos: 1 %
EOS (ABSOLUTE): 0.2 10*3/uL (ref 0.0–0.4)
Eos: 3 %
Hematocrit: 46 % (ref 37.5–51.0)
Hemoglobin: 15.8 g/dL (ref 13.0–17.7)
Immature Grans (Abs): 0.2 10*3/uL — ABNORMAL HIGH (ref 0.0–0.1)
Immature Granulocytes: 3 %
Lymphocytes Absolute: 1.6 10*3/uL (ref 0.7–3.1)
Lymphs: 31 %
MCH: 28.7 pg (ref 26.6–33.0)
MCHC: 34.3 g/dL (ref 31.5–35.7)
MCV: 84 fL (ref 79–97)
Monocytes Absolute: 0.6 10*3/uL (ref 0.1–0.9)
Monocytes: 12 %
Neutrophils Absolute: 2.6 10*3/uL (ref 1.4–7.0)
Neutrophils: 50 %
Platelets: 211 10*3/uL (ref 150–450)
RBC: 5.5 x10E6/uL (ref 4.14–5.80)
RDW: 12.7 % (ref 11.6–15.4)
WBC: 5.3 10*3/uL (ref 3.4–10.8)

## 2020-11-06 LAB — B12 AND FOLATE PANEL
Folate: 6.4 ng/mL (ref >3.0)
Vitamin B-12: 358 pg/mL (ref 232–1245)

## 2020-11-13 ENCOUNTER — Other Ambulatory Visit: Payer: Self-pay

## 2020-11-13 ENCOUNTER — Encounter: Payer: Self-pay | Admitting: Physician Assistant

## 2020-11-13 ENCOUNTER — Ambulatory Visit: Payer: Self-pay | Admitting: Physician Assistant

## 2020-11-13 VITALS — BP 124/83 | HR 93 | Temp 97.8°F | Resp 12 | Ht 72.0 in | Wt 220.0 lb

## 2020-11-13 DIAGNOSIS — H6123 Impacted cerumen, bilateral: Secondary | ICD-10-CM

## 2020-11-13 NOTE — Progress Notes (Signed)
   Subjective:cerumen impaction    Patient ID: Adrian Hughes, male    DOB: [birth year], 30 y.o.   MRN: [id number]  HPI Patient presents with bilateral hearing loss secondary to wax buildup.  Patient states this is a periodic problem.  Denies vertigo.   Review of Systems Negative septal complaint    Objective:   Physical Exam No acute distress.  Temperature 98.8, pulse 93, respiration 12, BP is 124/83, patient 90% O2 sat on room air. HEENT is remarkable bilateral cerumen impaction.       Assessment & Plan: Cerumen impaction.   Irrigation was only able to remove the impaction from the right ear.  Patient given a wax softener to use as directed return back in 4 days for reevaluation irrigation.

## 2020-11-13 NOTE — Progress Notes (Signed)
Pt has ear wax build up for about a week if not a little bit longer and pt tongue still dis colored.

## 2020-11-17 ENCOUNTER — Encounter: Payer: Self-pay | Admitting: Physician Assistant

## 2020-11-17 ENCOUNTER — Ambulatory Visit: Payer: Self-pay | Admitting: Physician Assistant

## 2020-11-17 ENCOUNTER — Other Ambulatory Visit: Payer: Self-pay

## 2020-11-17 DIAGNOSIS — H6123 Impacted cerumen, bilateral: Secondary | ICD-10-CM

## 2020-11-17 NOTE — Progress Notes (Signed)
   Subjective:Cerumen impaction     Patient ID: Adrian Hughes, male    DOB: [birth year], 30 y.o.   MRN: [id number]  HPI Patient presents from a hearing loss secondary to cerumen impaction.  Patient was seen 4 days ago for same complaint but was unable to have resolution with irrigation secondary to severe impaction.  Patient has been using earwax softener for the past 3 days.   Review of Systems Negative septal complaint.    Objective:   Physical Exam Patient has soft cerumen impaction left ear.       Assessment & Plan: Cerumen impaction   Cervical remove via irrigation.  Patient given discharge care instruction follow-up as necessary.

## 2021-02-18 ENCOUNTER — Other Ambulatory Visit: Payer: Self-pay

## 2021-02-18 NOTE — Progress Notes (Signed)
Pt completed random ETOH and Pending UDS with [facility]CL,RMA

## 2021-02-24 DIAGNOSIS — H5210 Myopia, unspecified eye: Secondary | ICD-10-CM | POA: Diagnosis not present

## 2021-06-24 ENCOUNTER — Ambulatory Visit: Payer: Self-pay

## 2021-06-24 ENCOUNTER — Other Ambulatory Visit: Payer: Self-pay

## 2021-06-24 VITALS — BP 119/79 | HR 85

## 2021-06-24 DIAGNOSIS — Z0000 Encounter for general adult medical examination without abnormal findings: Secondary | ICD-10-CM

## 2021-06-24 NOTE — Progress Notes (Signed)
3.15.23

## 2021-06-25 LAB — CMP12+LP+TP+TSH+6AC+CBC/D/PLT
ALT: 22 IU/L (ref 0–44)
AST: 18 IU/L (ref 0–40)
Albumin/Globulin Ratio: 2.2 (ref 1.2–2.2)
Albumin: 4.9 g/dL (ref 4.0–5.0)
Alkaline Phosphatase: 69 IU/L (ref 44–121)
BUN/Creatinine Ratio: 10 (ref 9–20)
BUN: 14 mg/dL (ref 6–20)
Basophils Absolute: 0.1 10*3/uL (ref 0.0–0.2)
Basos: 1 %
Bilirubin Total: 0.6 mg/dL (ref 0.0–1.2)
Calcium: 10.1 mg/dL (ref 8.7–10.2)
Chloride: 106 mmol/L (ref 96–106)
Chol/HDL Ratio: 4.6 ratio (ref 0.0–5.0)
Cholesterol, Total: 179 mg/dL (ref 100–199)
Creatinine, Ser: 1.46 mg/dL — ABNORMAL HIGH (ref 0.76–1.27)
EOS (ABSOLUTE): 0.2 10*3/uL (ref 0.0–0.4)
Eos: 2 %
Estimated CHD Risk: 0.9 times avg. (ref 0.0–1.0)
Free Thyroxine Index: 2.7 (ref 1.2–4.9)
GGT: 21 IU/L (ref 0–65)
Globulin, Total: 2.2 g/dL (ref 1.5–4.5)
Glucose: 97 mg/dL (ref 70–99)
HDL: 39 mg/dL — ABNORMAL LOW (ref >39)
Hematocrit: 48 % (ref 37.5–51.0)
Hemoglobin: 16.6 g/dL (ref 13.0–17.7)
Immature Grans (Abs): 0.1 10*3/uL (ref 0.0–0.1)
Immature Granulocytes: 1 %
Iron: 71 ug/dL (ref 38–169)
LDH: 226 IU/L — ABNORMAL HIGH (ref 121–224)
LDL Chol Calc (NIH): 112 mg/dL — ABNORMAL HIGH (ref 0–99)
Lymphocytes Absolute: 1.9 10*3/uL (ref 0.7–3.1)
Lymphs: 24 %
MCH: 28.6 pg (ref 26.6–33.0)
MCHC: 34.6 g/dL (ref 31.5–35.7)
MCV: 83 fL (ref 79–97)
Monocytes Absolute: 0.7 10*3/uL (ref 0.1–0.9)
Monocytes: 9 %
Neutrophils Absolute: 4.9 10*3/uL (ref 1.4–7.0)
Neutrophils: 63 %
Phosphorus: 3.2 mg/dL (ref 2.8–4.1)
Platelets: 239 10*3/uL (ref 150–450)
Potassium: 4.2 mmol/L (ref 3.5–5.2)
RBC: 5.81 x10E6/uL — ABNORMAL HIGH (ref 4.14–5.80)
RDW: 13.5 % (ref 11.6–15.4)
Sodium: 141 mmol/L (ref 134–144)
T3 Uptake Ratio: 33 % (ref 24–39)
T4, Total: 8.1 ug/dL (ref 4.5–12.0)
TSH: 1.38 u[IU]/mL (ref 0.450–4.500)
Total Protein: 7.1 g/dL (ref 6.0–8.5)
Triglycerides: 158 mg/dL — ABNORMAL HIGH (ref 0–149)
Uric Acid: 7.5 mg/dL (ref 3.8–8.4)
VLDL Cholesterol Cal: 28 mg/dL (ref 5–40)
WBC: 7.7 10*3/uL (ref 3.4–10.8)
eGFR: 65 mL/min/{1.73_m2} (ref >59)

## 2021-07-01 ENCOUNTER — Encounter: Payer: Self-pay | Admitting: Physician Assistant

## 2021-07-01 ENCOUNTER — Ambulatory Visit: Payer: Self-pay | Admitting: Physician Assistant

## 2021-07-01 ENCOUNTER — Other Ambulatory Visit: Payer: Self-pay

## 2021-07-01 VITALS — BP 131/88 | HR 86 | Temp 97.6°F | Resp 12 | Ht 73.0 in | Wt 220.0 lb

## 2021-07-01 DIAGNOSIS — Z0000 Encounter for general adult medical examination without abnormal findings: Secondary | ICD-10-CM

## 2021-07-01 NOTE — Progress Notes (Signed)
? ?[place] [facility] ? ?____________________________________________ ? ? None  ?  (approximate) ? ?I have reviewed the triage vital signs and the nursing notes. ? ? ?HISTORY ? ?Chief Complaint ?Annual Exam ? ? ? ?HPI ?Adrian Hughes is a 31 y.o. male patient presents for annual physical exam.  Patient voices no concerns or complaints. ?   ? ?  ? ? ?Past Medical History:  ?Diagnosis Date  ? Gluten intolerance   ? Keratoconus of both eyes   ? mainly right eye  ? ? ?Patient Active Problem List  ? Diagnosis Date Noted  ? Prepatellar bursitis 02/10/2017  ? Sepsis ([place]) 01/20/2017  ? ? ?Past Surgical History:  ?Procedure Laterality Date  ? COLONOSCOPY WITH PROPOFOL N/A 09/21/2019  ? Procedure: COLONOSCOPY WITH PROPOFOL;  Surgeon: Jonathon Bellows, MD;  Location: [facility] ENDOSCOPY;  Service: Gastroenterology;  Laterality: N/A;  ? INCISION AND DRAINAGE Left 01/24/2017  ? Procedure: INCISION AND DRAINAGE;  Surgeon: Lovell Sheehan, MD;  Location: [facility] ORS;  Service: Orthopedics;  Laterality: Left;  ? KNEE SURGERY    ? SURGERY SCROTAL / TESTICULAR    ? ? ?Prior to Admission medications   ?Medication Sig Start Date End Date Taking? Authorizing Provider  ?ibuprofen (ADVIL,MOTRIN) 600 MG tablet Take 1 tablet (600 mg total) by mouth every 6 (six) hours as needed. 01/18/17  Yes Alfred Levins, [place], MD  ?omeprazole (PRILOSEC) 40 MG capsule Take 1 capsule (40 mg total) by mouth daily. 05/07/19  Yes Jonathon Bellows, MD  ? ? ?Allergies ?Patient has no known allergies. ? ?Family History  ?Problem Relation Age of Onset  ? Gallbladder disease Father   ? Gallbladder disease Paternal Uncle   ? ? ?Social History ?Social History  ? ?Tobacco Use  ? Smoking status: Never  ? Smokeless tobacco: Never  ?Vaping Use  ? Vaping Use: Never used  ?Substance Use Topics  ? Alcohol use: Yes  ?  Comment: occ  ? Drug use: No  ? ? ?Review of Systems ?Constitutional: No fever/chills ?Eyes: No visual changes. ?ENT: No sore throat. ?Cardiovascular: Denies chest  pain. ?Respiratory: Denies shortness of breath. ?Gastrointestinal: No abdominal pain.  No nausea, no vomiting.  No diarrhea.  No constipation. ?Genitourinary: Negative for dysuria. ?Musculoskeletal: Negative for back pain. ?Skin: Negative for rash. ?Neurological: Negative for headaches, focal weakness or numbness. ?____________________________________________ ? ? ?PHYSICAL EXAM: ? ?VITAL SIGNS: Temperature 97.6, pulse 86, respiration 12, BP is 131/88, patient 90% O2 sat on room air.  Patient weighs 220 pounds and BMI is 29.03. ?Constitutional: Alert and oriented. Well appearing and in no acute distress. ?Eyes: Conjunctivae are normal. PERRL. EOMI. ?Head: Atraumatic. ?Nose: No congestion/rhinnorhea. ?Mouth/Throat: Mucous membranes are moist.  Oropharynx non-erythematous. ?Neck: No stridor.  No cervical spine tenderness to palpation. ?Hematological/Lymphatic/Immunilogical: No cervical lymphadenopathy. ?Cardiovascular: Normal rate, regular rhythm. Grossly normal heart sounds.  Good peripheral circulation. ?Respiratory: Normal respiratory effort.  No retractions. Lungs CTAB. ?Gastrointestinal: Soft and nontender. No distention. No abdominal bruits. No CVA tenderness. ?Genitourinary: Deferred ?Musculoskeletal: No lower extremity tenderness nor edema.  No joint effusions. ?Neurologic:  Normal speech and language. No gross focal neurologic deficits are appreciated. No gait instability. ?Skin:  Skin is warm, dry and intact. No rash noted. ?Psychiatric: Mood and affect are normal. Speech and behavior are normal. ? ?____________________________________________ ?  ?LABS ? ?_0 Result Notes ?           ?Component Ref Range & Units 7 d ago ?(06/24/21) 7 mo ago ?(11/05/20) 11 mo ago ?(07/14/20)  1 yr ago ?(09/11/19) 3 yr ago ?(05/01/18) 3 yr ago ?(05/01/18) 4 yr ago ?(01/23/17) 4 yr ago ?(01/23/17)  ?Glucose 70 - 99 mg/dL 97   89 R  95 R    102 High  R    ?Uric Acid 3.8 - 8.4 mg/dL 7.5   6.9 CM  7.6 CM       ?Comment:            Therapeutic  target for gout patients: <6.0  ?BUN 6 - 20 mg/dL _0 ?Creatinine, Ser 0.76 - 1.27 mg/dL 1.46 High    1.31 High   1.27    1.35 High  R    ?eGFR >59 mL/min/1.73 65   74        ?BUN/Creatinine Ratio 9 - _1 ?Sodium 134 - 144 mmol/L 141   141  144    140 R    ?Potassium 3.5 - 5.2 mmol/L 4.2   4.2  4.3    3.5 R    ?Chloride 96 - 106 mmol/L 106   105  108 High     108 R    ?Calcium 8.7 - 10.2 mg/dL 10.1   9.8  9.7    8.8 Low  R    ?Phosphorus 2.8 - 4.1 mg/dL 3.2   3.3  3.1       ?Total Protein 6.0 - 8.5 g/dL 7.1   6.9  6.6       ?Albumin 4.0 - 5.0 g/dL 4.9   4.6  4.5       ?Globulin, Total 1.5 - 4.5 g/dL 2.2   2.3  2.1       ?Albumin/Globulin Ratio 1.2 - 2.2 2.2   2.0  2.1       ?Bilirubin Total 0.0 - 1.2 mg/dL 0.6   0.7  0.7       ?Alkaline Phosphatase 44 - 121 IU/L 69   70  61 R, CM       ?LDH 121 - 224 IU/L 226 High    215  227 High        ?AST 0 - 40 IU/L _2 ?ALT 0 - 44 IU/L _3 ?GGT 0 - 65 IU/L _4 ?Iron 38 - 169 ug/dL 71   107  102       ?Cholesterol, Total 100 - 199 mg/dL 179   168  166       ?Triglycerides 0 - 149 mg/dL 158 High    138  80       ?HDL >39 mg/dL 39 Low    38 Low   43       ?VLDL Cholesterol Cal 5 - 40 mg/dL _5 ?LDL Chol Calc (NIH) 0 - 99 mg/dL 112 High    105 High   108 High        ?Chol/HDL Ratio 0.0 - 5.0 ratio 4.6   4.4 CM  3.9 CM       ?Comment:  T. Chol/HDL Ratio  ?                                            Men  Women  ?                              1/2 Avg.Risk  3.4    3.3  ?                                  Avg.Risk  5.0    4.4  ?                               2X Avg.Risk  9.6    7.1  ?                               3X Avg.Risk 23.4   11.0   ?Estimated CHD Risk 0.0 - 1.0 times avg. 0.9   0.9 CM  0.7 CM       ?Comment: The CHD Risk is based on the T. Chol/HDL ratio. Other  ?factors affect CHD Risk such as hypertension, smoking,  ?diabetes, severe obesity, and family  history of  ?premature CHD.   ?TSH 0.450 - 4.500 uIU/mL 1.380   0.966  0.965  3.550      ?T4, Total 4.5 - 12.0 ug/dL 8.1   8.0  6.5       ?T3 Uptake Ratio 24 - 39 % 33   29  30       ?Free Thyroxine Index 1.2 - 4.9 2.7   2.3  2.0       ?WBC 3.4 - 10.8 x10E3/uL 7.7  5.3  6.1  5.3   7.3   9.2 R   ?RBC 4.14 - 5.80 x10E6/uL 5.81 High   5.50  5.49  5.12   5.43   4.37 Low  R   ?Hemoglobin 13.0 - 17.7 g/dL 16.6  15.8  15.9  15.2   15.7   13.0 R   ?Hematocrit 37.5 - 51.0 % 48.0  46.0  45.6  43.6   45.4   36.3 Low  R   ?MCV 79 - 97 fL 83  84  83  85   84   83.2 R   ?MCH 26.6 - 33.0 pg 28.6  28.7  29.0  29.7   28.9   29.7 R   ?MCHC 31.5 - 35.7 g/dL 34.6  34.3  34.9  34.9   34.6   35.7 R   ?RDW 11.6 - 15.4 % 13.5  12.7  12.9  13.0   13.0 CM   13.0 R   ?Platelets 150 - 450 x10E3/uL 239  211  219  193   229   211 R   ?Neutrophils Not Estab. % 63  50  60  58   55   64 R   ?Lymphs Not Estab. % _0 ?Monocytes Not Estab. % _1 ?Eos Not Estab. % 2  _0 ?Basos Not Estab. % _1 ?Neutrophils Absolute 1.4 - 7.0 x10E3/uL 4.9  2.6  3.7  3.1   4.0   5.8 R   ?Lymphocytes Absolute 0.7 - 3.1 x10E3/uL 1.9  1.6  1.6  1.4   2.1   1.9 R   ?Monocytes Absolute 0.1 - 0.9 x10E3/uL 0.7  0.6  0.6  0.5   0.8     ?EOS (ABSOLUTE) 0.0 - 0.4 x10E3/uL 0.2  0.2  0.2  0.2   0.3     ?Basophils Absolute 0.0 - 0.2 x10E3/uL 0.1  0.1  0.0  0.0   0.0   0.1 R   ?Immature Granulocytes Not Estab. % 1  3  0  0   0     ?Immature Grans (Abs) 0.0 - 0.1 x10E3/uL 0.1  0.2 High  CM  0.0  0.0   0.0     ?Resulting Agency  _2  [facility]  ?  ? ?  ?Narrative ?Performed by: Maryan Puls ?Performed at:  [place]  ?[address], [place], [place]  [id number]  ?Lab Director: Rush Farmer MD, Phone:  [phone]  ?  ?Specimen Collected: 06/24/21 13:24 Last Resulted: 06/25/21 08:14  ?  ?  Lab Flowsheet   ? Order Details   ? View Encounter   ? Lab and  Collection Details   ? Routing   ? Result History    ?View Encounter Conversation    ?  ?CM=Additional comments  R=Reference range differs from displayed range    ?  ?Result Care Coordination ? ? ?Patient Communic

## 2021-07-01 NOTE — Progress Notes (Signed)
Pt denies any issues or concerns at this time. °

## 2021-07-30 DIAGNOSIS — L814 Other melanin hyperpigmentation: Secondary | ICD-10-CM | POA: Diagnosis not present

## 2021-07-30 DIAGNOSIS — D485 Neoplasm of uncertain behavior of skin: Secondary | ICD-10-CM | POA: Diagnosis not present

## 2022-04-06 ENCOUNTER — Encounter: Payer: Self-pay | Admitting: Physician Assistant

## 2022-04-06 ENCOUNTER — Other Ambulatory Visit: Payer: Self-pay | Admitting: Physician Assistant

## 2022-04-06 DIAGNOSIS — R52 Pain, unspecified: Secondary | ICD-10-CM

## 2022-04-06 DIAGNOSIS — R519 Headache, unspecified: Secondary | ICD-10-CM

## 2022-04-06 DIAGNOSIS — R051 Acute cough: Secondary | ICD-10-CM

## 2022-04-06 LAB — POCT INFLUENZA A/B
Influenza A, POC: POSITIVE — AB
Influenza B, POC: NEGATIVE

## 2022-04-06 LAB — POC COVID19 BINAXNOW: SARS Coronavirus 2 Ag: NEGATIVE

## 2022-04-06 MED ORDER — IBUPROFEN 800 MG PO TABS: 800.0000 mg | ORAL_TABLET | Freq: Three times a day (TID) | ORAL | 0 refills | Status: DC | PRN

## 2022-04-06 MED ORDER — PROMETHAZINE-DM 6.25-15 MG/5ML PO SYRP: 5.0000 mL | ORAL_SOLUTION | Freq: Four times a day (QID) | ORAL | 0 refills | Status: DC | PRN

## 2022-04-06 MED ORDER — OSELTAMIVIR PHOSPHATE 75 MG PO CAPS: 75.0000 mg | ORAL_CAPSULE | Freq: Two times a day (BID) | ORAL | 0 refills | Status: DC

## 2022-04-06 NOTE — Progress Notes (Signed)
S/Sx started yesterday: Fever of 101 this morning Bodyaches Cough - productive at times Slight headache Head & Chest congestion Denies N/V/D  States wife has flu Called requesting Tamiflu  Has been taking generic OTC Dayquil & Nyquil  AMD

## 2022-04-06 NOTE — Progress Notes (Signed)
   Subjective: Flu symptoms    Patient ID: Adrian Hughes, male    DOB: 1991/01/27, 31 y.o.   MRN: [id number]  HPI Patient complaining of fever/chills, productive cough, and bodyaches.  Patient denies taking flu shot this year.  Patient states exposed to family members diagnosed with flu. Denies recent travel or known contact with COVID-19.  Patient test positive for influenza today. Review of Systems Negative except for chief complaint    Objective:   Physical Exam  Deferred secondary to telephonic encounter.      Assessment & Plan: Influenza A   Patient given a prescription of Tamiflu, Phenergan DM, ibuprofen.  Advised to follow-up with no improvement or worsening complaints.

## 2022-05-26 ENCOUNTER — Ambulatory Visit: Payer: Self-pay

## 2022-05-26 DIAGNOSIS — Z0000 Encounter for general adult medical examination without abnormal findings: Secondary | ICD-10-CM

## 2022-05-26 LAB — POCT URINALYSIS DIPSTICK
Bilirubin, UA: NEGATIVE
Blood, UA: NEGATIVE
Glucose, UA: NEGATIVE
Ketones, UA: NEGATIVE
Leukocytes, UA: NEGATIVE
Nitrite, UA: NEGATIVE
Protein, UA: NEGATIVE
Spec Grav, UA: >=1.03 — AB (ref 1.010–1.025)
Urobilinogen, UA: 0.2 E.U./dL
pH, UA: 6 (ref 5.0–8.0)

## 2022-05-26 NOTE — Progress Notes (Signed)
Pt presents today to complete lab portion of physical. /CL,RMA 

## 2022-05-27 LAB — CMP12+LP+TP+TSH+6AC+CBC/D/PLT
ALT: 26 IU/L (ref 0–44)
AST: 20 IU/L (ref 0–40)
Albumin/Globulin Ratio: 2.1 (ref 1.2–2.2)
Albumin: 4.8 g/dL (ref 4.1–5.1)
Alkaline Phosphatase: 62 IU/L (ref 44–121)
BUN/Creatinine Ratio: 9 (ref 9–20)
BUN: 13 mg/dL (ref 6–20)
Basophils Absolute: 0 10*3/uL (ref 0.0–0.2)
Basos: 1 %
Bilirubin Total: 0.7 mg/dL (ref 0.0–1.2)
Calcium: 9.9 mg/dL (ref 8.7–10.2)
Chloride: 103 mmol/L (ref 96–106)
Chol/HDL Ratio: 4.2 ratio (ref 0.0–5.0)
Cholesterol, Total: 182 mg/dL (ref 100–199)
Creatinine, Ser: 1.41 mg/dL — ABNORMAL HIGH (ref 0.76–1.27)
EOS (ABSOLUTE): 0.2 10*3/uL (ref 0.0–0.4)
Eos: 3 %
Estimated CHD Risk: 0.8 times avg. (ref 0.0–1.0)
Free Thyroxine Index: 2.5 (ref 1.2–4.9)
GGT: 22 IU/L (ref 0–65)
Globulin, Total: 2.3 g/dL (ref 1.5–4.5)
Glucose: 101 mg/dL — ABNORMAL HIGH (ref 70–99)
HDL: 43 mg/dL (ref >39)
Hematocrit: 45.8 % (ref 37.5–51.0)
Hemoglobin: 15.9 g/dL (ref 13.0–17.7)
Immature Grans (Abs): 0 10*3/uL (ref 0.0–0.1)
Immature Granulocytes: 0 %
Iron: 78 ug/dL (ref 38–169)
LDH: 233 IU/L — ABNORMAL HIGH (ref 121–224)
LDL Chol Calc (NIH): 121 mg/dL — ABNORMAL HIGH (ref 0–99)
Lymphocytes Absolute: 1.5 10*3/uL (ref 0.7–3.1)
Lymphs: 26 %
MCH: 28.4 pg (ref 26.6–33.0)
MCHC: 34.7 g/dL (ref 31.5–35.7)
MCV: 82 fL (ref 79–97)
Monocytes Absolute: 0.6 10*3/uL (ref 0.1–0.9)
Monocytes: 11 %
Neutrophils Absolute: 3.3 10*3/uL (ref 1.4–7.0)
Neutrophils: 59 %
Phosphorus: 2.7 mg/dL — ABNORMAL LOW (ref 2.8–4.1)
Platelets: 238 10*3/uL (ref 150–450)
Potassium: 4.4 mmol/L (ref 3.5–5.2)
RBC: 5.59 x10E6/uL (ref 4.14–5.80)
RDW: 12.9 % (ref 11.6–15.4)
Sodium: 141 mmol/L (ref 134–144)
T3 Uptake Ratio: 32 % (ref 24–39)
T4, Total: 7.9 ug/dL (ref 4.5–12.0)
TSH: 1.55 u[IU]/mL (ref 0.450–4.500)
Total Protein: 7.1 g/dL (ref 6.0–8.5)
Triglycerides: 101 mg/dL (ref 0–149)
Uric Acid: 7 mg/dL (ref 3.8–8.4)
VLDL Cholesterol Cal: 18 mg/dL (ref 5–40)
WBC: 5.6 10*3/uL (ref 3.4–10.8)
eGFR: 67 mL/min/{1.73_m2} (ref >59)

## 2022-06-02 ENCOUNTER — Encounter: Payer: Self-pay | Admitting: Physician Assistant

## 2022-06-02 ENCOUNTER — Ambulatory Visit: Payer: Self-pay | Admitting: Physician Assistant

## 2022-06-02 VITALS — BP 121/72 | HR 91 | Temp 97.7°F | Resp 14 | Wt 221.0 lb

## 2022-06-02 DIAGNOSIS — F4010 Social phobia, unspecified: Secondary | ICD-10-CM

## 2022-06-02 DIAGNOSIS — T753XXA Motion sickness, initial encounter: Secondary | ICD-10-CM

## 2022-06-02 DIAGNOSIS — Z0000 Encounter for general adult medical examination without abnormal findings: Secondary | ICD-10-CM

## 2022-06-02 MED ORDER — MECLIZINE HCL 25 MG PO TABS: 25.0000 mg | ORAL_TABLET | Freq: Three times a day (TID) | ORAL | 2 refills | Status: AC | PRN

## 2022-06-02 MED ORDER — ALPRAZOLAM 0.25 MG PO TABS: 0.2500 mg | ORAL_TABLET | Freq: Two times a day (BID) | ORAL | 0 refills | Status: AC | PRN

## 2022-06-02 NOTE — Progress Notes (Signed)
[facility]   ____________________________________________   None    (approximate)  I have reviewed the triage vital signs and the nursing notes.   HISTORY  Chief Complaint No chief complaint on file.   HPI Adrian Hughes is a 32 y.o. male patient presents for annual physical exam.  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  Patient states has no problem when he is driving.  Patient also voices concern for social anxiety.  Patient states he has noticed increased anxiety with social interaction in crowds.         Past Medical History:  Diagnosis Date   Gluten intolerance    Keratoconus of both eyes    mainly right eye    Patient Active Problem List   Diagnosis Date Noted   Prepatellar bursitis 02/10/2017   Sepsis ([place]) 01/20/2017    Past Surgical History:  Procedure Laterality Date   COLONOSCOPY WITH PROPOFOL N/A 09/21/2019   Procedure: COLONOSCOPY WITH PROPOFOL;  Surgeon: Jonathon Bellows, MD;  Location: [facility] ENDOSCOPY;  Service: Gastroenterology;  Laterality: N/A;   INCISION AND DRAINAGE Left 01/24/2017   Procedure: INCISION AND DRAINAGE;  Surgeon: Lovell Sheehan, MD;  Location: [facility] ORS;  Service: Orthopedics;  Laterality: Left;   KNEE SURGERY     SURGERY SCROTAL / TESTICULAR      Prior to Admission medications   Not on File    Allergies Patient has no known allergies.  Family History  Problem Relation Age of Onset   Gallbladder disease Father    Gallbladder disease Paternal Uncle     Social History Social History   Tobacco Use   Smoking status: Never   Smokeless tobacco: Never  Vaping Use   Vaping Use: Never used  Substance Use Topics   Alcohol use: Yes    Comment: occ   Drug use: No    Review of Systems Constitutional: No fever/chills Eyes: No visual changes. ENT: No sore throat.  Motion sickness  Cardiovascular: Denies chest pain. Respiratory: Denies  shortness of breath. Gastrointestinal: No abdominal pain.  No nausea, no vomiting.  No diarrhea.  No constipation. Genitourinary: Negative for dysuria. Musculoskeletal: Negative for back pain. Skin: Negative for rash. Neurological: Negative for headaches, focal weakness or numbness. Psychiatric: Anxiety  ____________________________________________   PHYSICAL EXAM:  VITAL SIGNS: BP is 121/72, pulse 91, respiration 14, temperature 97.7, and patient 99% O2 sat on room air.  Patient weighs 229 pounds and BMI is 29.16. Constitutional: Alert and oriented. Well appearing and in no acute distress. Eyes: Conjunctivae are normal. PERRL. EOMI. Head: Atraumatic. Nose: No congestion/rhinnorhea. Mouth/Throat: Mucous membranes are moist.  Oropharynx non-erythematous. Neck: No stridor.  No cervical spine tenderness to palpation. Hematological/Lymphatic/Immunilogical: No cervical lymphadenopathy. Cardiovascular: Normal rate, regular rhythm. Grossly normal heart sounds.  Good peripheral circulation. Respiratory: Normal respiratory effort.  No retractions. Lungs CTAB. Gastrointestinal: Soft and nontender. No distention. No abdominal bruits. No CVA tenderness. Genitourinary: Deferred Musculoskeletal: No lower extremity tenderness nor edema.  No joint effusions. Neurologic:  Normal speech and language. No gross focal neurologic deficits are appreciated. No gait instability. Skin:  Skin is warm, dry and intact. No rash noted. Psychiatric: Mood and affect are normal. Speech and behavior are normal.  ____________________________________________   LABS _  Ref Range & Units 7 d ago 1 yr ago 2 yr ago   Color, UA  amber Dark Yellow dark yellow  Clarity, UA  clear Clear cloudy  Glucose, UA Negative Negative Negative Negative  Bilirubin, UA  neg Negative negative  Ketones, UA  neg Negative negative  Spec Grav, UA 1.010 - 1.025 >=1.030 Abnormal  1.020 >=1.030 Abnormal   Blood, UA  neg Negative negative   pH, UA 5.0 - 8.0 6.0 6.0 5.0  Protein, UA Negative Negative Positive Abnormal  CM Negative  Urobilinogen, UA 0.2 or 1.0 E.U./dL 0.2 0.2 0.2  Nitrite, UA  neg Negative negatve  Leukocytes, UA Negative Negative Negative Negative  Appearance  dark    Odor                         Component Ref Range & Units 7 d ago (05/26/22) 11 mo ago (06/24/21) 1 yr ago (11/05/20) 1 yr ago (07/14/20) 2 yr ago (09/11/19) 4 yr ago (05/01/18) 4 yr ago (05/01/18)  Glucose 70 - 99 mg/dL 101 High  97  89 R 95 R    Uric Acid 3.8 - 8.4 mg/dL 7.0 7.5 CM  6.9 CM 7.6 CM    Comment:            Therapeutic target for gout patients: <6.0  BUN 6 - 20 mg/dL 13 14  13 13    $ Creatinine, Ser 0.76 - 1.27 mg/dL 1.41 High  1.46 High   1.31 High  1.27    eGFR >59 mL/min/1.73 67 65  74     BUN/Creatinine Ratio 9 - 20 9 10  10 10    $ Sodium 134 - 144 mmol/L 141 141  141 144    Potassium 3.5 - 5.2 mmol/L 4.4 4.2  4.2 4.3    Chloride 96 - 106 mmol/L 103 106  105 108 High     Calcium 8.7 - 10.2 mg/dL 9.9 10.1  9.8 9.7    Phosphorus 2.8 - 4.1 mg/dL 2.7 Low  3.2  3.3 3.1    Total Protein 6.0 - 8.5 g/dL 7.1 7.1  6.9 6.6    Albumin 4.1 - 5.1 g/dL 4.8 4.9 R  4.6 R 4.5 R    Globulin, Total 1.5 - 4.5 g/dL 2.3 2.2  2.3 2.1    Albumin/Globulin Ratio 1.2 - 2.2 2.1 2.2  2.0 2.1    Bilirubin Total 0.0 - 1.2 mg/dL 0.7 0.6  0.7 0.7    Alkaline Phosphatase 44 - 121 IU/L 62 69  70 61 R, CM    LDH 121 - 224 IU/L 233 High  226 High   215 227 High     AST 0 - 40 IU/L 20 18  17 19    $ ALT 0 - 44 IU/L 26 22  19 20    $ GGT 0 - 65 IU/L 22 21  21 19    $ Iron 38 - 169 ug/dL 78 71  107 102    Cholesterol, Total 100 - 199 mg/dL 182 179  168 166    Triglycerides 0 - 149 mg/dL 101 158 High   138 80    HDL >39 mg/dL 43 39 Low   38 Low  43    VLDL Cholesterol Cal 5 - 40 mg/dL 18 28  25 15    $ LDL Chol Calc (NIH) 0 - 99 mg/dL 121 High  112 High   105 High  108 High     Chol/HDL Ratio 0.0 - 5.0 ratio 4.2 4.6 CM  4.4 CM 3.9 CM    Comment:  T. Chol/HDL Ratio                                             Men  Women                               1/2 Avg.Risk  3.4    3.3                                   Avg.Risk  5.0    4.4                                2X Avg.Risk  9.6    7.1                                3X Avg.Risk 23.4   11.0  Estimated CHD Risk 0.0 - 1.0 times avg. 0.8 0.9 CM  0.9 CM 0.7 CM    Comment: The CHD Risk is based on the T. Chol/HDL ratio. Other factors affect CHD Risk such as hypertension, smoking, diabetes, severe obesity, and family history of premature CHD.  TSH 0.450 - 4.500 uIU/mL 1.550 1.380  0.966 0.965 3.550   T4, Total 4.5 - 12.0 ug/dL 7.9 8.1  8.0 6.5    T3 Uptake Ratio 24 - 39 % 32 33  29 30    Free Thyroxine Index 1.2 - 4.9 2.5 2.7  2.3 2.0    WBC 3.4 - 10.8 x10E3/uL 5.6 7.7 5.3 6.1 5.3  7.3  RBC 4.14 - 5.80 x10E6/uL 5.59 5.81 High  5.50 5.49 5.12  5.43  Hemoglobin 13.0 - 17.7 g/dL 15.9 16.6 15.8 15.9 15.2  15.7  Hematocrit 37.5 - 51.0 % 45.8 48.0 46.0 45.6 43.6  45.4  MCV 79 - 97 fL 82 83 84 83 85  84  MCH 26.6 - 33.0 pg 28.4 28.6 28.7 29.0 29.7  28.9  MCHC 31.5 - 35.7 g/dL 34.7 34.6 34.3 34.9 34.9  34.6  RDW 11.6 - 15.4 % 12.9 13.5 12.7 12.9 13.0  13.0 CM  Platelets 150 - 450 x10E3/uL 238 239 211 219 193  229  Neutrophils Not Estab. % 59 63 50 60 58  55  Lymphs Not Estab. % 26 24 31 26 26  29  $ Monocytes Not Estab. % 11 9 12 10 10  11  $ Eos Not Estab. % 3 2 3 3 5  4  $ Basos Not Estab. % 1 1 1 1 1  1  $ Neutrophils Absolute 1.4 - 7.0 x10E3/uL 3.3 4.9 2.6 3.7 3.1  4.0  Lymphocytes Absolute 0.7 - 3.1 x10E3/uL 1.5 1.9 1.6 1.6 1.4  2.1  Monocytes Absolute 0.1 - 0.9 x10E3/uL 0.6 0.7 0.6 0.6 0.5  0.8  EOS (ABSOLUTE) 0.0 - 0.4 x10E3/uL 0.2 0.2 0.2 0.2 0.2  0.3  Basophils Absolute 0.0 - 0.2 x10E3/uL 0.0 0.1 0.1 0.0 0.0  0.0  Immature Granulocytes Not Estab. % 0 1 3 0 0  0                 ________________________________   ____________________________________________    ____________________________________________   INITIAL  IMPRESSION / ASSESSMENT AND PLAN As part of my medical decision making, I reviewed the following data within the [place]      No acute finding on physical exam and labs.        ____________________________________________   FINAL CLINICAL IMPRESSION Motion sickness. Social anxiety. Well exam Patient given a prescription for Xanax 0.25 mg twice daily as needed for social anxiety.  Prescription for Antivert 25 mg 3 times daily as needed for motion sickness.  Follow-up if no improvement of these 2 complaints. ED Discharge Orders     None        Note:  This document was prepared using Dragon voice recognition software and may include unintentional dictation errors.

## 2022-06-02 NOTE — Progress Notes (Signed)
Here for yearly physical with provider for [employer].  No c/o voiced.

## 2023-06-01 ENCOUNTER — Ambulatory Visit: Payer: Self-pay

## 2023-06-01 DIAGNOSIS — Z0000 Encounter for general adult medical examination without abnormal findings: Secondary | ICD-10-CM

## 2023-06-01 LAB — POCT URINALYSIS DIPSTICK
Bilirubin, UA: NEGATIVE
Blood, UA: NEGATIVE
Glucose, UA: NEGATIVE
Ketones, UA: NEGATIVE
Leukocytes, UA: NEGATIVE
Nitrite, UA: NEGATIVE
Protein, UA: POSITIVE — AB
Spec Grav, UA: >=1.03 — AB (ref 1.010–1.025)
Urobilinogen, UA: 0.2 U/dL
pH, UA: 6 (ref 5.0–8.0)

## 2023-06-01 NOTE — Progress Notes (Signed)
Pt presents today to complete physical labs. Adrian Hughes

## 2023-06-02 LAB — CMP12+LP+TP+TSH+6AC+CBC/D/PLT
ALT: 24 [IU]/L (ref 0–44)
AST: 23 [IU]/L (ref 0–40)
Albumin: 4.8 g/dL (ref 4.1–5.1)
Alkaline Phosphatase: 62 [IU]/L (ref 44–121)
BUN/Creatinine Ratio: 9 (ref 9–20)
BUN: 11 mg/dL (ref 6–20)
Basophils Absolute: 0 10*3/uL (ref 0.0–0.2)
Basos: 1 %
Bilirubin Total: 0.8 mg/dL (ref 0.0–1.2)
Calcium: 9.9 mg/dL (ref 8.7–10.2)
Chloride: 104 mmol/L (ref 96–106)
Chol/HDL Ratio: 4.6 {ratio} (ref 0.0–5.0)
Cholesterol, Total: 207 mg/dL — ABNORMAL HIGH (ref 100–199)
Creatinine, Ser: 1.28 mg/dL — ABNORMAL HIGH (ref 0.76–1.27)
EOS (ABSOLUTE): 0.1 10*3/uL (ref 0.0–0.4)
Eos: 2 %
Estimated CHD Risk: 0.9 times avg. (ref 0.0–1.0)
Free Thyroxine Index: 2.5 (ref 1.2–4.9)
GGT: 24 [IU]/L (ref 0–65)
Globulin, Total: 2.7 g/dL (ref 1.5–4.5)
Glucose: 96 mg/dL (ref 70–99)
HDL: 45 mg/dL (ref >39)
Hematocrit: 43.6 % (ref 37.5–51.0)
Hemoglobin: 14.8 g/dL (ref 13.0–17.7)
Immature Grans (Abs): 0 10*3/uL (ref 0.0–0.1)
Immature Granulocytes: 0 %
Iron: 85 ug/dL (ref 38–169)
LDH: 254 [IU]/L — ABNORMAL HIGH (ref 121–224)
LDL Chol Calc (NIH): 133 mg/dL — ABNORMAL HIGH (ref 0–99)
Lymphocytes Absolute: 1.6 10*3/uL (ref 0.7–3.1)
Lymphs: 25 %
MCH: 28.8 pg (ref 26.6–33.0)
MCHC: 33.9 g/dL (ref 31.5–35.7)
MCV: 85 fL (ref 79–97)
Monocytes Absolute: 0.7 10*3/uL (ref 0.1–0.9)
Monocytes: 11 %
Neutrophils Absolute: 3.7 10*3/uL (ref 1.4–7.0)
Neutrophils: 61 %
Phosphorus: 3.3 mg/dL (ref 2.8–4.1)
Platelets: 241 10*3/uL (ref 150–450)
Potassium: 3.8 mmol/L (ref 3.5–5.2)
RBC: 5.13 x10E6/uL (ref 4.14–5.80)
RDW: 12.6 % (ref 11.6–15.4)
Sodium: 143 mmol/L (ref 134–144)
T3 Uptake Ratio: 30 % (ref 24–39)
T4, Total: 8.3 ug/dL (ref 4.5–12.0)
TSH: 1.02 u[IU]/mL (ref 0.450–4.500)
Total Protein: 7.5 g/dL (ref 6.0–8.5)
Triglycerides: 162 mg/dL — ABNORMAL HIGH (ref 0–149)
Uric Acid: 7 mg/dL (ref 3.8–8.4)
VLDL Cholesterol Cal: 29 mg/dL (ref 5–40)
WBC: 6.1 10*3/uL (ref 3.4–10.8)
eGFR: 75 mL/min/{1.73_m2} (ref >59)

## 2023-06-06 ENCOUNTER — Encounter: Payer: Self-pay | Admitting: Physician Assistant

## 2023-06-06 ENCOUNTER — Ambulatory Visit: Payer: Self-pay | Admitting: Physician Assistant

## 2023-06-06 VITALS — BP 121/89 | HR 79 | Temp 97.6°F | Resp 14 | Ht 73.0 in | Wt 220.0 lb

## 2023-06-06 DIAGNOSIS — Z0000 Encounter for general adult medical examination without abnormal findings: Secondary | ICD-10-CM

## 2023-06-06 NOTE — Progress Notes (Signed)
 [facility]   None    (approximate)  I have reviewed the triage vital signs and the nursing notes.   HISTORY  Chief Complaint Annual Exam   HPI Adrian Hughes is a 33 y.o. male patient presents for annual physical exam.         Past Medical History:  Diagnosis Date   Gluten intolerance    Keratoconus of both eyes    mainly right eye    Patient Active Problem List   Diagnosis Date Noted   Prepatellar bursitis 02/10/2017   Sepsis (HCC) 01/20/2017    Past Surgical History:  Procedure Laterality Date   COLONOSCOPY WITH PROPOFOL N/A 09/21/2019   Procedure: COLONOSCOPY WITH PROPOFOL;  Surgeon: [name], MD;  Location: [facility];  Service: Gastroenterology;  Laterality: N/A;   INCISION AND DRAINAGE Left 01/24/2017   Procedure: INCISION AND DRAINAGE;  Surgeon: Lyndle Herrlich, MD;  Location: [facility] ORS;  Service: Orthopedics;  Laterality: Left;   KNEE SURGERY     SURGERY SCROTAL / TESTICULAR      Prior to Admission medications   Medication Sig Start Date End Date Taking? Authorizing Provider  ALPRAZolam (XANAX) 0.25 MG tablet Take 1 tablet (0.25 mg total) by mouth 2 (two) times daily as needed for anxiety. 06/02/22   Joni Reining, PA-C  meclizine (ANTIVERT) 25 MG tablet Take 1 tablet (25 mg total) by mouth 3 (three) times daily as needed for dizziness. 06/02/22   Joni Reining, PA-C    Allergies Patient has no known allergies.  Family History  Problem Relation Age of Onset   Gallbladder disease Father    Gallbladder disease Paternal Uncle     Social History Social History   Tobacco Use   Smoking status: Never   Smokeless tobacco: Never  Vaping Use   Vaping status: Never Used  Substance Use Topics   Alcohol use: Yes    Comment: occ   Drug use: No    Review of Systems Constitutional: No fever/chills Eyes: No visual changes. ENT: No sore throat. Cardiovascular: Denies chest pain. Respiratory: Denies  shortness of breath. Gastrointestinal: No abdominal pain.  No nausea, no vomiting.  No diarrhea.  No constipation. Genitourinary: Negative for dysuria. Musculoskeletal: Negative for back pain. Skin: Negative for rash. Neurological: Negative for headaches, focal weakness or numbness. ____________________________________________   PHYSICAL EXAM:  VITAL SIGNS: BP 121/89  Cuff Size Large  Pulse Rate 79  Temp 97.6 F (36.4 C)  Temp Source Temporal  Weight 220 lb (99.8 kg)  Height 6\' 1"  (1.854 m)  Resp 14  SpO2 99 %   BMI: 29.03 kg/m2  BSA: 2.27 m2   Constitutional: Alert and oriented. Well appearing and in no acute distress. Eyes: Conjunctivae are normal. PERRL. EOMI. Head: Atraumatic. Nose: No congestion/rhinnorhea. Mouth/Throat: Mucous membranes are moist.  Oropharynx non-erythematous. Neck: No stridor.  No cervical spine tenderness to palpation. Hematological/Lymphatic/Immunilogical: No cervical lymphadenopathy. Cardiovascular: Normal rate, regular rhythm. Grossly normal heart sounds.  Good peripheral circulation. Respiratory: Normal respiratory effort.  No retractions. Lungs CTAB. Gastrointestinal: Soft and nontender. No distention. No abdominal bruits. No CVA tenderness. Genitourinary: Deferred Musculoskeletal: No lower extremity tenderness nor edema.  No joint effusions. Neurologic:  Normal speech and language. No gross focal neurologic deficits are appreciated. No gait instability. Skin:  Skin is warm, dry and intact. No rash noted. Psychiatric: Mood and affect are normal. Speech and behavior are normal.  ____________________________________________   LABS _  Component Ref Range & Units (hover) 5 d ago 1 yr ago 2 yr ago 3 yr ago  Color, UA dark yellow amber Dark Yellow dark yellow  Clarity, UA clear clear Clear cloudy  Glucose, UA Negative Negative Negative Negative  Bilirubin, UA neg neg Negative negative  Ketones, UA neg neg Negative negative  Spec Grav,  UA >=1.030 Abnormal  >=1.030 Abnormal  1.020 >=1.030 Abnormal   Blood, UA neg neg Negative negative  pH, UA 6.0 6.0 6.0 5.0  Protein, UA Positive Abnormal  Negative Positive Abnormal  CM Negative  Comment: trace -+  Urobilinogen, UA 0.2 0.2 0.2 0.2  Nitrite, UA neg neg Negative negatve  Leukocytes, UA Negative Negative Negative Negative  Appearance  dark    Odor                 View All Conversations on this Encounter                Component Ref Range & Units (hover) 5 d ago (06/01/23) 1 yr ago (05/26/22) 1 yr ago (06/24/21) 2 yr ago (11/05/20) 2 yr ago (07/14/20) 3 yr ago (09/11/19) 5 yr ago (05/01/18) 5 yr ago (05/01/18)  Glucose 96 101 High  97  89 R 95 R    Uric Acid 7.0 7.0 CM 7.5 CM  6.9 CM 7.6 CM    Comment:            Therapeutic target for gout patients: <6.0  BUN 11 13 14  13 13     Creatinine, Ser 1.28 High  1.41 High  1.46 High   1.31 High  1.27    eGFR 75 67 65  74     BUN/Creatinine Ratio 9 9 10  10 10     Sodium 143 141 141  141 144    Potassium 3.8 4.4 4.2  4.2 4.3    Chloride 104 103 106  105 108 High     Calcium 9.9 9.9 10.1  9.8 9.7    Phosphorus 3.3 2.7 Low  3.2  3.3 3.1    Total Protein 7.5 7.1 7.1  6.9 6.6    Albumin 4.8 4.8 4.9 R  4.6 R 4.5 R    Globulin, Total 2.7 2.3 2.2  2.3 2.1    Bilirubin Total 0.8 0.7 0.6  0.7 0.7    Alkaline Phosphatase 62 62 69  70 61 R, CM    LDH 254 High  233 High  226 High   215 227 High     AST 23 20 18  17 19     ALT 24 26 22  19 20     GGT 24 22 21  21 19     Iron 85 78 71  107 102    Cholesterol, Total 207 High  182 179  168 166    Triglycerides 162 High  101 158 High   138 80    HDL 45 43 39 Low   38 Low  43    VLDL Cholesterol Cal 29 18 28  25 15     LDL Chol Calc (NIH) 133 High  121 High  112 High   105 High  108 High     Chol/HDL Ratio 4.6 4.2 CM 4.6 CM  4.4 CM 3.9 CM    Comment:  T. Chol/HDL Ratio                                             Men  Women                                1/2 Avg.Risk  3.4    3.3                                   Avg.Risk  5.0    4.4                                2X Avg.Risk  9.6    7.1                                3X Avg.Risk 23.4   11.0  Estimated CHD Risk 0.9 0.8 CM 0.9 CM  0.9 CM 0.7 CM    Comment: The CHD Risk is based on the T. Chol/HDL ratio. Other factors affect CHD Risk such as hypertension, smoking, diabetes, severe obesity, and family history of premature CHD.  TSH 1.020 1.550 1.380  0.966 0.965 3.550   T4, Total 8.3 7.9 8.1  8.0 6.5    T3 Uptake Ratio 30 32 33  29 30    Free Thyroxine Index 2.5 2.5 2.7  2.3 2.0    WBC 6.1 5.6 7.7 5.3 6.1 5.3  7.3  RBC 5.13 5.59 5.81 High  5.50 5.49 5.12  5.43  Hemoglobin 14.8 15.9 16.6 15.8 15.9 15.2  15.7  Hematocrit 43.6 45.8 48.0 46.0 45.6 43.6  45.4  MCV 85 82 83 84 83 85  84  MCH 28.8 28.4 28.6 28.7 29.0 29.7  28.9  MCHC 33.9 34.7 34.6 34.3 34.9 34.9  34.6  RDW 12.6 12.9 13.5 12.7 12.9 13.0  13.0 CM  Platelets 241 238 239 211 219 193  229  Neutrophils 61 59 63 50 60 58  55  Lymphs 25 26 24 31 26 26  29   Monocytes 11 11 9 12 10 10  11   Eos 2 3 2 3 3 5  4   Basos 1 1 1 1 1 1  1   Neutrophils Absolute 3.7 3.3 4.9 2.6 3.7 3.1  4.0  Lymphocytes Absolute 1.6 1.5 1.9 1.6 1.6 1.4  2.1  Monocytes Absolute 0.7 0.6 0.7 0.6 0.6 0.5  0.8  EOS (ABSOLUTE) 0.1 0.2 0.2 0.2 0.2 0.2  0.3  Basophils Absolute 0.0 0.0 0.1 0.1 0.0 0.0  0.0  Immature Granulocytes 0 0 1 3 0 0  0  Immature Grans (Abs) 0.0 0.0 0.1 0.2 High  CM 0.0 0.0  0.0              ___________________________________________  EKG  Sinus rhythm at 77 bpm ____________________________________________    ____________________________________________   INITIAL IMPRESSION / ASSESSMENT AND PLAN  As part of my medical decision making, I reviewed the following data within the electronic MEDICAL RECORD NUMBER Notes from prior ED visits and [place] Controlled Substance Database      No acute findings on physical exam.  Labs reveal  proteinuria and elevated triglycerides.  Patient states will increase diet and exercise and follow-up in 6 months.      ____________________________________________   FINAL CLINICAL IMPRESSION    ED Discharge Orders     None        Note:  This document was prepared using Dragon voice recognition software and may include unintentional dictation errors.

## 2023-06-06 NOTE — Progress Notes (Signed)
 Pt presents today to complete physical, Pt denies any issues or concerns at this time/CL,RMA

## 2023-06-22 DIAGNOSIS — H52223 Regular astigmatism, bilateral: Secondary | ICD-10-CM | POA: Diagnosis not present

## 2024-02-07 ENCOUNTER — Other Ambulatory Visit: Payer: Self-pay

## 2024-02-07 NOTE — Progress Notes (Signed)
 Pt presents today to complete random UDS and ETOH.

## 2024-06-19 ENCOUNTER — Encounter: Admitting: Physician Assistant
